# Patient Record
Sex: Male | Born: 1995 | Race: White | Hispanic: No | Marital: Single | Smoking: Former smoker
Health system: Southern US, Community
[De-identification: ages and names within clinical notes are randomized; demographics above are authoritative.]

---
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
## 2014-10-28 ENCOUNTER — Emergency Department (HOSPITAL_BASED_OUTPATIENT_CLINIC_OR_DEPARTMENT_OTHER): Payer: Medicaid Other

## 2014-10-28 ENCOUNTER — Emergency Department (HOSPITAL_BASED_OUTPATIENT_CLINIC_OR_DEPARTMENT_OTHER)
Admission: EM | Admit: 2014-10-28 | Discharge: 2014-10-28 | Disposition: A | Discharge status: Home / Self Care | Payer: Medicaid Other | Attending: Emergency Medicine | Admitting: Emergency Medicine

## 2014-10-28 ENCOUNTER — Encounter (HOSPITAL_BASED_OUTPATIENT_CLINIC_OR_DEPARTMENT_OTHER): Payer: Self-pay

## 2014-10-28 DIAGNOSIS — Z79899 Other long term (current) drug therapy: Secondary | ICD-10-CM | POA: Insufficient documentation

## 2014-10-28 DIAGNOSIS — M25562 Pain in left knee: Secondary | ICD-10-CM | POA: Insufficient documentation

## 2014-10-28 DIAGNOSIS — Z791 Long term (current) use of non-steroidal anti-inflammatories (NSAID): Secondary | ICD-10-CM | POA: Diagnosis not present

## 2014-10-28 NOTE — ED Provider Notes (Signed)
CSN: [id number]     Arrival date & time 10/28/14  1615 History   First MD Initiated Contact with Patient 10/28/14 1616     Chief Complaint  Patient presents with  . Knee Pain     (Consider location/radiation/quality/duration/timing/severity/associated sxs/prior Treatment) Patient is a 19 y.o. male presenting with knee pain.  Knee Pain Location:  Knee Time since incident:  3 months Injury: no   Knee location:  L knee Pain details:    Quality:  Aching   Severity:  Severe   Onset quality:  Gradual   Timing:  Constant   Progression:  Worsening Chronicity:  New Prior injury to area:  No Relieved by:  Nothing Worsened by:  Extension Ineffective treatments:  None tried Associated symptoms: stiffness   Associated symptoms: no back pain, no fever, no itching, no muscle weakness, no neck pain, no numbness and no swelling     History reviewed. No pertinent past medical history. History reviewed. No pertinent past surgical history. No family history on file. History  Substance Use Topics  . Smoking status: Never Smoker   . Smokeless tobacco: Never Used  . Alcohol Use: No    Review of Systems  Constitutional: Negative for fever.  Musculoskeletal: Positive for stiffness. Negative for back pain and neck pain.  Skin: Negative for itching.  All other systems reviewed and are negative.     Allergies  Review of patient's allergies indicates no known allergies.  Home Medications   Prior to Admission medications   Medication Sig Start Date End Date Taking? Authorizing Provider  diclofenac (VOLTAREN) 50 MG EC tablet Take 50 mg by mouth 2 (two) times daily.   Yes Historical Provider, MD  HYDROCODONE BITARTRATE PO Take by mouth.   Yes Historical Provider, MD  pantoprazole (PROTONIX) 20 MG tablet Take 1 tablet (20 mg total) by mouth 2 (two) times daily. 06/02/13   Toy CookeyMegan Docherty, MD   BP 133/81 mmHg  Pulse 114  Temp(Src) 98.6 F (37 C) (Oral)  Resp 18  Ht 5\' 8"  (1.727 m)  Wt  133 lb (60.328 kg)  BMI 20.23 kg/m2  SpO2 100% Physical Exam  Constitutional: He is oriented to person, place, and time. He appears well-developed and well-nourished.  HENT:  Head: Normocephalic and atraumatic.  Eyes: Conjunctivae and EOM are normal.  Neck: Normal range of motion. Neck supple.  Cardiovascular: Normal rate, regular rhythm and normal heart sounds.   Pulmonary/Chest: Effort normal and breath sounds normal. No respiratory distress.  Abdominal: He exhibits no distension. There is no tenderness. There is no rebound and no guarding.  Musculoskeletal: Normal range of motion.       Left knee: He exhibits normal range of motion, no swelling, no erythema, normal alignment, no LCL laxity, normal patellar mobility, normal meniscus and no MCL laxity. Tenderness (diffuse) found.  Neurological: He is alert and oriented to person, place, and time.  Skin: Skin is warm and dry.  Vitals reviewed.   ED Course  Procedures (including critical care time) Labs Review Labs Reviewed - No data to display  Imaging Review Dg Knee Complete 4 Views Left  10/28/2014   CLINICAL DATA:  Lt knee pain at patella has worsened over a 2 month period. No old injury known.  EXAM: LEFT KNEE - COMPLETE 4+ VIEW  COMPARISON:  None.  FINDINGS: There is no evidence of fracture, dislocation, or joint effusion. There is no evidence of arthropathy or other focal bone abnormality. Soft tissues are unremarkable.  IMPRESSION: Negative.  Electronically Signed   By: Amie Portland M.D.   On: 10/28/2014 17:27     EKG Interpretation None      MDM   Final diagnoses:  Left knee pain    19 y.o. male without pertinent PMH presents with atraumatic L knee pain x 2-3 months, worsening over last 2 weeks.  Immediate precipitant for visit was that he states he was told by a pharmacist when asking about a knee brace that he needed to come to the ED for an MRI of his knee.  On arrival vitals and physical exam as above.  No signs  of SA, FROM with some pain.  No ligamentous instability appreciated.  No swelling of calf or leg, pt states it is occasionally tender, but is not on my exam today.  Sensation intact distally.  Pulses 2+.  XR obtained here unremarkable.  DC home to fu with orthopedics.    I have reviewed all laboratory and imaging studies if ordered as above  1. Left knee pain         Mirian Mo, MD 10/28/14 [id number]

## 2014-10-28 NOTE — ED Notes (Signed)
Pt reports left knee pain x 2 weeks, pain in left groin, reports kneecap "always hurting".  Denies injury. States needs mri, has not had xray.

## 2014-10-28 NOTE — Discharge Instructions (Signed)

## 2014-10-29 ENCOUNTER — Encounter (HOSPITAL_BASED_OUTPATIENT_CLINIC_OR_DEPARTMENT_OTHER): Payer: Self-pay | Admitting: Emergency Medicine

## 2014-10-29 ENCOUNTER — Emergency Department (HOSPITAL_BASED_OUTPATIENT_CLINIC_OR_DEPARTMENT_OTHER)
Admission: EM | Admit: 2014-10-29 | Discharge: 2014-10-29 | Disposition: A | Discharge status: Home / Self Care | Payer: Medicaid Other | Attending: Emergency Medicine | Admitting: Emergency Medicine

## 2014-10-29 DIAGNOSIS — M25562 Pain in left knee: Secondary | ICD-10-CM | POA: Diagnosis not present

## 2014-10-29 DIAGNOSIS — Z791 Long term (current) use of non-steroidal anti-inflammatories (NSAID): Secondary | ICD-10-CM | POA: Diagnosis not present

## 2014-10-29 DIAGNOSIS — Z79899 Other long term (current) drug therapy: Secondary | ICD-10-CM | POA: Insufficient documentation

## 2014-10-29 NOTE — Discharge Instructions (Signed)

## 2014-10-29 NOTE — ED Notes (Signed)
Pt recently here yesterday for left knee pain. Knee is wrapped. States last night foot on left side was cold to touch. Was told to return to be checked on.

## 2014-10-29 NOTE — ED Provider Notes (Signed)
TIME SEEN: 11:14 PM  CHIEF COMPLAINT: Left knee pain  HPI:  Reginald [name] is a 19 y.o. male who presents to the Emergency Department complaining of worsening left knee pain onset 2 months. Pt denies an injury to the knee. Pt was seen here yesterday for the same symptoms, pt notes that he was informed to return to be checked after he was called this afternoon. Pt reports that last night his foot on the left side was cold to the touch and that the ace wrap on his knee was not too tight. He is unable to tell me if his right foot was also cold. Denies any discoloration. Has an appointment to see Dr. Pearletha Forge tomorrow. Denies numbness or focal weakness. States his foot is warm today.   Pt has numerous issues with his health and he does not currently have a PCP to visit. States he has had years worth of pain intermittently in his chest, abdomen, back, neck that will shoot throughout his body. He has not had any symptoms currently and has not had any of the symptoms in the past several days.   ROS: See HPI Constitutional: no fever  Eyes: no drainage  ENT: no runny nose   Cardiovascular:  no chest pain  Resp: no SOB  GI: no vomiting GU: no dysuria Integumentary: no rash  Allergy: no hives  Musculoskeletal: no leg swelling  Neurological: no slurred speech ROS otherwise negative  PAST MEDICAL HISTORY/PAST SURGICAL HISTORY:  History reviewed. No pertinent past medical history.  MEDICATIONS:  Prior to Admission medications   Medication Sig Start Date End Date Taking? Authorizing Provider  diclofenac (VOLTAREN) 50 MG EC tablet Take 50 mg by mouth 2 (two) times daily.   Yes Historical Provider, MD  HYDROCODONE BITARTRATE PO Take by mouth.   Yes Historical Provider, MD  pantoprazole (PROTONIX) 20 MG tablet Take 1 tablet (20 mg total) by mouth 2 (two) times daily. 06/02/13   Toy Cookey, MD    ALLERGIES:  No Known Allergies  SOCIAL HISTORY:  History  Substance Use Topics  . Smoking status:  Never Smoker   . Smokeless tobacco: Never Used  . Alcohol Use: No    FAMILY HISTORY: No family history on file.  EXAM: BP 121/79 mmHg  Pulse 100  Temp(Src) 98.3 F (36.8 C) (Oral)  Resp 18  Ht  (1.727 m)  Wt 133 lb (60.328 kg)  BMI 20.23 kg/m2  SpO2 97% CONSTITUTIONAL: Alert and oriented and responds appropriately to questions. Well-appearing; well-nourished, appears very anxious, nontoxic, afebrile HEAD: Normocephalic EYES: Conjunctivae clear, PERRL ENT: normal nose; no rhinorrhea; moist mucous membranes; pharynx without lesions noted NECK: Supple, no meningismus, no LAD  CARD: RRR; S1 and S2 appreciated; no murmurs, no clicks, no rubs, no gallops RESP: Normal chest excursion without splinting or tachypnea; breath sounds clear and equal bilaterally; no wheezes, no rhonchi, no rales, no hypoxia or respiratory distress, speaking full sentences ABD/GI: Normal bowel sounds; non-distended; soft, non-tender, no rebound, no guarding, no peritoneal signs BACK:  The back appears normal and is non-tender to palpation, there is no CVA tenderness EXT:  tender to palpation over the left knee without bony deformity or joint effusion, no erythema or warmth, no ligamentous laxity, no bony deformity, Normal ROM in all joints; otherwise extremities are non-tender to palpation; no edema; normal capillary refill; no cyanosis, no calf tenderness or swelling; both of his lower extremities are warm and well perfused with strong palpable DP pulses bilaterally  SKIN: Normal color for age and race; warm NEURO: Moves all extremities equally, sensation to light touch intact diffusely, cranial nerves II through XII intact PSYCH: Patient appears very anxious. Grooming and personal hygiene are appropriate.  MEDICAL DECISION MAKING: Patient here with an episode of feeling like his left foot was numb last night. Symptoms are now gone. He has strong pulses, no calf tenderness or swelling and has normal  sensation. No bony deformity on exam and has had negative x-rays yesterday. No sign of septic arthritis. This may have been secondary to having his Ace wrapped too tight last night. Have advised him to leave off the knee brace and Ace wrap at this time. I do not feel he needs further emergent workup at this time. Discussed with patient that I do not feel he has any arterial obstruction, DVT or other emergent condition. He has follow-up tomorrow.  Patient also complains of multiple other painful complaints that have been present intermittently for years. None of them have occurred in the last several days or present currently. I do not feel these need emergent workup at this time I have discussed with him the importance of primary care follow-up. I feel that anxiety contributes significantly to patient's multiple symptoms.  I personally performed the services described in this documentation, which was scribed in my presence. The recorded information has been reviewed and is accurate.   Layla MawKristen N Apurva Reily, DO 10/30/14 [phone]

## 2014-10-30 ENCOUNTER — Encounter: Payer: Self-pay | Admitting: Family Medicine

## 2014-10-30 ENCOUNTER — Ambulatory Visit (INDEPENDENT_AMBULATORY_CARE_PROVIDER_SITE_OTHER): Payer: Medicaid Other | Admitting: Family Medicine

## 2014-10-30 ENCOUNTER — Ambulatory Visit: Payer: Medicaid Other | Admitting: Family Medicine

## 2014-10-30 VITALS — BP 134/71 | HR 123 | Ht 69.0 in | Wt 135.0 lb

## 2014-10-30 DIAGNOSIS — M25562 Pain in left knee: Secondary | ICD-10-CM

## 2014-10-30 DIAGNOSIS — M545 Low back pain, unspecified: Secondary | ICD-10-CM

## 2014-10-30 MED ORDER — DICLOFENAC SODIUM 75 MG PO TBEC: 75.0000 mg | DELAYED_RELEASE_TABLET | Freq: Two times a day (BID) | ORAL | Status: DC

## 2014-10-30 NOTE — Patient Instructions (Signed)
Your knee pain may be from a small meniscus tear. Otherwise your exam is normal and reassuring. You would have to do 6 weeks of conservative treatment before an MRI would be approved for this issue. Diclofenac twice a day with food. Knee brace for support. Icing if needed 15 minutes at a time 3-4 times a day. Elevate above your heart as needed for swelling. Follow up with me in 6 weeks for both issues. If not improving MRI would be the next step.  Whether your back pain is from a bulging disc or lumbar strain these are treated similarly initially. Take tylenol for baseline pain relief (1-2 extra strength tabs 3x/day) Diclofenac twice a day with food for pain and inflammation. Consider robaxin as needed for muscle spasms (no driving on this medicine). Physical therapy and home exercises are important for long term pain relief. If not improving, will consider further imaging (MRI).

## 2014-11-04 DIAGNOSIS — M25562 Pain in left knee: Secondary | ICD-10-CM | POA: Insufficient documentation

## 2014-11-04 DIAGNOSIS — M545 Low back pain, unspecified: Secondary | ICD-10-CM | POA: Insufficient documentation

## 2014-11-04 NOTE — Progress Notes (Signed)
PCP: No PCP Per Patient  Subjective:   HPI: Patient is a 19 y.o. male here for left knee pain.  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 Denies known injury or trauma. 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 Hurts to put any pressure on leg. Pain level 8/10. Difficulty straightening the leg. + limping. Radiographs negative. He also mentioned at end of visit about his back pain starting about the same time.  No past medical history on file.  Current Outpatient Prescriptions on File Prior to Visit  Medication Sig Dispense Refill  . pantoprazole (PROTONIX) 20 MG tablet Take 1 tablet (20 mg total) by mouth 2 (two) times daily. 60 tablet 0   No current facility-administered medications on file prior to visit.    No past surgical history on file.  No Known Allergies  History   Social History  . Marital Status: Single    Spouse Name: N/A  . Number of Children: N/A  . Years of Education: N/A   Occupational History  . Not on file.   Social History Main Topics  . Smoking status: Never Smoker   . Smokeless tobacco: Never Used  . Alcohol Use: No  . Drug Use: No  . Sexual Activity: Not on file   Other Topics Concern  . Not on file   Social History Narrative    No family history on file.  BP 134/71 mmHg  Pulse 123  Ht 5\' 9"  (1.753 m)  Wt 135 lb (61.236 kg)  BMI 19.93 kg/m2  Review of Systems: See HPI above.    Objective:  Physical Exam:  Gen: NAD  Left knee: No gross deformity, ecchymoses, effusion.  Guarding. Diffuse TTP. ROM 0 - 100 degrees, guarding. Negative ant/post drawers. Negative valgus/varus testing. Negative lachmanns. Negative mcmurrays, apleys though both with pain.  Negative patellar apprehension. NV intact distally.    Assessment & Plan:  1. Left knee pain - Most testing benign.  No known injury though possible he has a small meniscus  tear in his knee.  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  Start with conservative measures - diclofenac, knee brace, icing, home exercises.  Consider MRI if not improving at f/u in 6 weeks.  2. Low back pain - Did not examine today as brought up at very end of visit - could dedicate future appointment to evaluation.  Given generic instructions, exercises to start.

## 2014-11-04 NOTE — Assessment & Plan Note (Signed)
Did not examine today as brought up at very end of visit - could dedicate future appointment to evaluation.  Given generic instructions, exercises to start.

## 2014-11-04 NOTE — Assessment & Plan Note (Signed)
Most testing benign.  No known injury though possible he has a small meniscus tear in his knee.  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  Start with conservative measures - diclofenac, knee brace, icing, home exercises.  Consider MRI if not improving at f/u in 6 weeks.

## 2014-11-27 ENCOUNTER — Emergency Department (HOSPITAL_BASED_OUTPATIENT_CLINIC_OR_DEPARTMENT_OTHER)
Admission: EM | Admit: 2014-11-27 | Discharge: 2014-11-28 | Disposition: A | Discharge status: Home / Self Care | Payer: Medicaid Other | Attending: Emergency Medicine | Admitting: Emergency Medicine

## 2014-11-27 ENCOUNTER — Encounter (HOSPITAL_BASED_OUTPATIENT_CLINIC_OR_DEPARTMENT_OTHER): Payer: Self-pay | Admitting: *Deleted

## 2014-11-27 DIAGNOSIS — M25512 Pain in left shoulder: Secondary | ICD-10-CM | POA: Insufficient documentation

## 2014-11-27 DIAGNOSIS — M79605 Pain in left leg: Secondary | ICD-10-CM | POA: Insufficient documentation

## 2014-11-27 DIAGNOSIS — R0789 Other chest pain: Secondary | ICD-10-CM | POA: Insufficient documentation

## 2014-11-27 DIAGNOSIS — R079 Chest pain, unspecified: Secondary | ICD-10-CM | POA: Diagnosis present

## 2014-11-27 DIAGNOSIS — R6889 Other general symptoms and signs: Secondary | ICD-10-CM

## 2014-11-27 DIAGNOSIS — M25562 Pain in left knee: Secondary | ICD-10-CM | POA: Diagnosis not present

## 2014-11-27 DIAGNOSIS — M79604 Pain in right leg: Secondary | ICD-10-CM | POA: Insufficient documentation

## 2014-11-27 DIAGNOSIS — F419 Anxiety disorder, unspecified: Secondary | ICD-10-CM | POA: Insufficient documentation

## 2014-11-27 DIAGNOSIS — Z720 Tobacco use: Secondary | ICD-10-CM | POA: Diagnosis not present

## 2014-11-27 DIAGNOSIS — M25511 Pain in right shoulder: Secondary | ICD-10-CM | POA: Diagnosis not present

## 2014-11-27 NOTE — ED Notes (Signed)
Pt reports generalized body aches and pain in left chest that has been bothering him intermittently over the last year to year and a half. Currently denies SOB skin is P/W/D and chest pain that occurs with deep breathing. Mr Reginald [name] also report increased SOB when changing position from lying to standing. Pt also c/o intermittent loss of memory and mild confusion while at work.       Mr Reginald [name] has also requested an MRI of his body to find out why he is in pain and having cognitive difficulty. He is very insistent that an MRI will help found out what is wrong with him.

## 2014-11-27 NOTE — ED Notes (Signed)
Pt has generalized body aches, memory loss, dizziness.  Multiple complaints.  No acute distress noted, pt ambulatory, A/O x 4.

## 2014-11-27 NOTE — ED Notes (Signed)
Pt asked for STD test Reported to RN

## 2014-11-27 NOTE — ED Provider Notes (Addendum)
TIME SEEN:  This chart was scribed for Layla Maw Ward, DO by Octavia Heir, ED Scribe. This patient was seen in room MH11/MH11 and the patient's care was started at 11:54 PM.   CHIEF COMPLAINT: multiple complaints  HPI:  HPI Comments: Reginald [name] is a 19 y.o. male who presents to the Emergency Department complaining of multiple complaints. Pt reports having intermittent left sided chest pain with increase pain upon movement onset one year ago. Pt also reports having bilateral leg and bilateral shoulder pain that is worse with movement and palpation without history of injury. 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 Describes this as episodes were he can't remember what he is doing. Denies numbness, tingling or focal weakness. Also complains of lightheadedness with standing. No fever, cough, vomiting or diarrhea. No shortness of breath. States he also had pain with an erection recently. No dysuria or hematuria. He is requesting that we do STD screening today. No penile discharge, testicular pain or swelling. Patient is requesting that we do a "full body MRI" to figure out what is wrong with him. States he is followed up with a primary care physician and told that nothing was wrong.  ROS: See HPI Constitutional: no fever  Eyes: no drainage  ENT: no runny nose   Cardiovascular:   chest pain  Resp: no SOB  GI: no vomiting GU: no dysuria Integumentary: no rash  Allergy: no hives  Musculoskeletal: no leg swelling  Neurological: no slurred speech ROS otherwise negative  PAST MEDICAL HISTORY/PAST SURGICAL HISTORY:  History reviewed. No pertinent past medical history.  MEDICATIONS:  Prior to Admission medications   Not on File    ALLERGIES:  No Known Allergies  SOCIAL HISTORY:  History  Substance Use Topics  . Smoking status: Current Every Day Smoker -- 0.50 packs/day    Types: Cigarettes  . Smokeless tobacco: Never Used  . Alcohol Use: No    FAMILY  HISTORY: History reviewed. No pertinent family history.  EXAM: Triage vitals: BP 117/76 mmHg  Pulse 100  Temp(Src) 98.5 F (36.9 C) (Oral)  Resp 18  Ht  (1.727 m)  Wt 140 lb (63.504 kg)  BMI 21.29 kg/m2  SpO2 95% CONSTITUTIONAL: Alert and oriented 3 and responds appropriately to questions. Well-appearing; well-nourished, laughing, in no distress, nontoxic, afebrile HEAD: Normocephalic EYES: Conjunctivae clear, PERRL, extraocular movements intact, no scleral icterus ENT: normal nose; no rhinorrhea; moist mucous membranes; pharynx without lesions noted, no tonsillar hypertrophy or exudate, no uvular deviation, normal phonation, no sign of dental abscess, no Ludwig angina NECK: Supple, no meningismus, no LAD; no midline spinal tenderness or step-off or deformity CARD: RRR; S1 and S2 appreciated; no murmurs, no clicks, no rubs, no gallops CHEST:  Tender to palpation over the left chest wall without crepitus or ecchymosis or deformity RESP: Normal chest excursion without splinting or tachypnea; breath sounds clear and equal bilaterally; no wheezes, no rhonchi, no rales, no hypoxia or respiratory distress, speaking full sentences ABD/GI: Normal bowel sounds; non-distended; soft, non-tender, no rebound, no guarding, no peritoneal signs BACK:  The back appears normal, there is no CVA tenderness, tender to palpation over the bilateral trapezius and rhomboid muscles without associated lesions, no midline spinal tenderness or step-off or deformity EXT: Patient's left knee is in a brace and he complains of pain in this area but has had negative x-rays on a recent visit July 7 with no new injury, no joint effusion, compartments are soft, Normal  ROM in all joints; otherwise extremities are non-tender to palpation; no edema; normal capillary refill; no cyanosis, no calf tenderness or swelling    SKIN: Normal color for age and race; warm, no rash NEURO: Moves all extremities equally, sensation to  light touch intact diffusely, cranial nerves II through XII intact, no dysmetria to finger to nose testing, normal gait PSYCH: Patient appears anxious, will laugh inappropriately. No SI or HI.   MEDICAL DECISION MAKING: Patient with multiple vague complaints that he reports he has had intermittently for the past year. He is requesting that we do a "full body MRI" and states that he was upset that his primary care physician told him that nothing was wrong with him. His exam today is completely normal and his vital signs are also normal. He is not orthostatic. EKG shows no ischemic changes, arrhythmia or interval changes. Screening labs, urine today are also completely unremarkable. STD screening is pending. Discussed with patient if any of this was positive he would be contacted. I have discussed with patient at length that I do not feel there is an emergent life-threatening illness present and I recommend follow-up with primary care physician. I suspect that many of his symptoms are due to anxiety which I have discussed with him on this visit as well as a previous visit when I saw this patient in July.  Discussed return precautions. He verbalized understanding.    Layla Maw Ward, DO 11/28/14 0122   EKG Interpretation  Date/Time:  Thursday November 27 2014 23:43:12 EDT Ventricular Rate:  92 PR Interval:  174 QRS Duration: 84 QT Interval:  338 QTC Calculation: 417 R Axis:   78 Text Interpretation:  Normal sinus rhythm Normal ECG Confirmed by WARD,  DO, KRISTEN ([id number]) on 11/27/2014 11:51:03 PM        Layla Maw Ward, DO 11/28/14 0122

## 2014-11-28 ENCOUNTER — Telehealth: Payer: Self-pay | Admitting: Family Medicine

## 2014-11-28 LAB — CBC WITH DIFFERENTIAL/PLATELET
BASOS ABS: 0.1 10*3/uL (ref 0.0–0.1)
BASOS PCT: 1 % (ref 0–1)
EOS ABS: 0.3 10*3/uL (ref 0.0–0.7)
Eosinophils Relative: 5 % (ref 0–5)
HCT: 44.5 % (ref 39.0–52.0)
Hemoglobin: 14.9 g/dL (ref 13.0–17.0)
LYMPHS ABS: 1.7 10*3/uL (ref 0.7–4.0)
Lymphocytes Relative: 28 % (ref 12–46)
MCH: 28.9 pg (ref 26.0–34.0)
MCHC: 33.5 g/dL (ref 30.0–36.0)
MCV: 86.2 fL (ref 78.0–100.0)
MONO ABS: 0.5 10*3/uL (ref 0.1–1.0)
MONOS PCT: 9 % (ref 3–12)
NEUTROS ABS: 3.6 10*3/uL (ref 1.7–7.7)
Neutrophils Relative %: 57 % (ref 43–77)
PLATELETS: 191 10*3/uL (ref 150–400)
RBC: 5.16 MIL/uL (ref 4.22–5.81)
RDW: 15.3 % (ref 11.5–15.5)
WBC: 6.2 10*3/uL (ref 4.0–10.5)

## 2014-11-28 LAB — URINALYSIS, ROUTINE W REFLEX MICROSCOPIC
Bilirubin Urine: NEGATIVE
Glucose, UA: NEGATIVE mg/dL
Hgb urine dipstick: NEGATIVE
Ketones, ur: NEGATIVE mg/dL
LEUKOCYTES UA: NEGATIVE
Nitrite: NEGATIVE
PROTEIN: NEGATIVE mg/dL
Specific Gravity, Urine: 1.025 (ref 1.005–1.030)
Urobilinogen, UA: 0.2 mg/dL (ref 0.0–1.0)
pH: 6 (ref 5.0–8.0)

## 2014-11-28 LAB — COMPREHENSIVE METABOLIC PANEL
ALT: 15 U/L — ABNORMAL LOW (ref 17–63)
ANION GAP: 9 (ref 5–15)
AST: 24 U/L (ref 15–41)
Albumin: 4.1 g/dL (ref 3.5–5.0)
Alkaline Phosphatase: 49 U/L (ref 38–126)
BUN: 13 mg/dL (ref 6–20)
CO2: 24 mmol/L (ref 22–32)
Calcium: 9.2 mg/dL (ref 8.9–10.3)
Chloride: 107 mmol/L (ref 101–111)
Creatinine, Ser: 0.96 mg/dL (ref 0.61–1.24)
GFR calc Af Amer: >60 mL/min (ref >60)
GFR calc non Af Amer: >60 mL/min (ref >60)
GLUCOSE: 89 mg/dL (ref 65–99)
Potassium: 3.6 mmol/L (ref 3.5–5.1)
SODIUM: 140 mmol/L (ref 135–145)
Total Bilirubin: 0.5 mg/dL (ref 0.3–1.2)
Total Protein: 6.7 g/dL (ref 6.5–8.1)

## 2014-11-28 NOTE — Telephone Encounter (Signed)
Spoke to patient and told him that he needed to make a follow up appointment and we could discuss options about his left knee. Appointment made.

## 2014-11-28 NOTE — Discharge Instructions (Signed)
You were seen in the emergency department for multiple different complaints. Your blood work today has been normal as well as your urine. Your exam was also normal as well as your vital signs. I do not feel there is an emergent condition present and I recommend follow-up with your primary care physician for continued evaluation for your complaints.  We have also tested you for sexually transmitted diseases. These tests will take several days to come back. If they're positive, you will be contacted.

## 2014-11-29 LAB — RPR: RPR Ser Ql: NONREACTIVE

## 2014-11-29 LAB — HIV ANTIBODY (ROUTINE TESTING W REFLEX): HIV Screen 4th Generation wRfx: NONREACTIVE

## 2014-12-11 ENCOUNTER — Ambulatory Visit: Payer: Medicaid Other | Admitting: Family Medicine

## 2014-12-16 ENCOUNTER — Encounter (INDEPENDENT_AMBULATORY_CARE_PROVIDER_SITE_OTHER): Payer: Self-pay

## 2014-12-16 ENCOUNTER — Encounter: Payer: Self-pay | Admitting: Family Medicine

## 2014-12-16 ENCOUNTER — Ambulatory Visit (INDEPENDENT_AMBULATORY_CARE_PROVIDER_SITE_OTHER): Payer: Medicaid Other | Admitting: Family Medicine

## 2014-12-16 VITALS — BP 113/68 | HR 84 | Ht 68.0 in | Wt 140.0 lb

## 2014-12-16 DIAGNOSIS — M25562 Pain in left knee: Secondary | ICD-10-CM

## 2014-12-20 ENCOUNTER — Ambulatory Visit (HOSPITAL_BASED_OUTPATIENT_CLINIC_OR_DEPARTMENT_OTHER)
Admission: RE | Admit: 2014-12-20 | Discharge: 2014-12-20 | Disposition: A | Discharge status: Home / Self Care | Payer: Medicaid Other | Source: Ambulatory Visit | Attending: Family Medicine | Admitting: Family Medicine

## 2014-12-20 DIAGNOSIS — M25562 Pain in left knee: Secondary | ICD-10-CM | POA: Diagnosis present

## 2014-12-23 NOTE — Progress Notes (Signed)
PCP: No PCP Per Patient  Subjective:   HPI: Patient is a 19 y.o. male here for left knee pain.  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 Hurts to put any pressure on leg. Pain level 8/10. Difficulty straightening the leg. + limping. Radiographs negative. He also mentioned at end of visit about his back pain starting about the same time.  8/23: Patient reports his pain level is 6/10 still. Is limping though having mild improvement since last visit only. Doing home exercises - had not heard from PT. Using knee brace, heat, elevating as well. He had missed 7/19-21 at work due to knee issues. Has crutches he used during this time but not needing at this point. No catching, locking.  No past medical history on file.  No current outpatient prescriptions on file prior to visit.   No current facility-administered medications on file prior to visit.    No past surgical history on file.  No Known Allergies  Social History   Social History  . Marital Status: Single    Spouse Name: N/A  . Number of Children: N/A  . Years of Education: N/A   Occupational History  . Not on file.   Social History Main Topics  . Smoking status: Current Every Day Smoker -- 0.50 packs/day    Types: Cigarettes  . Smokeless tobacco: Never Used  . Alcohol Use: No  . Drug Use: No  . Sexual Activity: Not on file   Other Topics Concern  . Not on file   Social History Narrative    No family history on file.  BP 113/68 mmHg  Pulse 84  Ht  (1.727 m)  Wt 140 lb (63.504 kg)  BMI 21.29 kg/m2  Review of Systems: See HPI above.    Objective:  Physical Exam:  Gen: NAD  Left knee: No gross deformity, ecchymoses, effusion. Diffuse TTP including joint lines, post patellar facets. FROM. Negative ant/post  drawers. Negative valgus/varus testing. Negative lachmanns. Painful mcmurrays, apleys.  Painful patellar apprehension. NV intact distally.    Assessment & Plan:  1. Left knee pain - Most testing benign aside from diffuse pain, has pain with meniscal testing.  Not improving as expected over 6 weeks with HEP, bracing.  Will go ahead with MRI to further assess for meniscus tear.  Addendum:  MRI reviewed and discussed with patient.  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  Reassured patient.  Will start physical therapy, plan to follow-up in 6 weeks.

## 2014-12-23 NOTE — Assessment & Plan Note (Signed)
Most testing benign aside from diffuse pain, has pain with meniscal testing.  Not improving as expected over 6 weeks with HEP, bracing.  Will go ahead with MRI to further assess for meniscus tear.

## 2016-02-15 ENCOUNTER — Encounter (HOSPITAL_BASED_OUTPATIENT_CLINIC_OR_DEPARTMENT_OTHER): Payer: Self-pay | Admitting: *Deleted

## 2016-02-15 ENCOUNTER — Emergency Department (HOSPITAL_BASED_OUTPATIENT_CLINIC_OR_DEPARTMENT_OTHER)
Admission: EM | Admit: 2016-02-15 | Discharge: 2016-02-15 | Disposition: A | Discharge status: Home / Self Care | Payer: Medicaid Other | Attending: Emergency Medicine | Admitting: Emergency Medicine

## 2016-02-15 DIAGNOSIS — H9313 Tinnitus, bilateral: Secondary | ICD-10-CM

## 2016-02-15 DIAGNOSIS — T39011A Poisoning by aspirin, accidental (unintentional), initial encounter: Secondary | ICD-10-CM

## 2016-02-15 DIAGNOSIS — F17210 Nicotine dependence, cigarettes, uncomplicated: Secondary | ICD-10-CM | POA: Diagnosis not present

## 2016-02-15 LAB — BASIC METABOLIC PANEL
Anion gap: 5 (ref 5–15)
BUN: 14 mg/dL (ref 6–20)
CO2: 21 mmol/L — ABNORMAL LOW (ref 22–32)
Calcium: 8.5 mg/dL — ABNORMAL LOW (ref 8.9–10.3)
Chloride: 111 mmol/L (ref 101–111)
Creatinine, Ser: 0.93 mg/dL (ref 0.61–1.24)
GFR calc Af Amer: >60 mL/min (ref >60)
GLUCOSE: 129 mg/dL — AB (ref 65–99)
Potassium: 3.8 mmol/L (ref 3.5–5.1)
Sodium: 137 mmol/L (ref 135–145)

## 2016-02-15 LAB — RAPID URINE DRUG SCREEN, HOSP PERFORMED
Amphetamines: NOT DETECTED
BARBITURATES: NOT DETECTED
Benzodiazepines: NOT DETECTED
COCAINE: NOT DETECTED
Opiates: NOT DETECTED
TETRAHYDROCANNABINOL: NOT DETECTED

## 2016-02-15 LAB — ACETAMINOPHEN LEVEL: Acetaminophen (Tylenol), Serum: <10 ug/mL — ABNORMAL LOW (ref 10–30)

## 2016-02-15 LAB — SALICYLATE LEVEL
Salicylate Lvl: 21.1 mg/dL (ref 2.8–30.0)
Salicylate Lvl: 16 mg/dL (ref 2.8–30.0)

## 2016-02-15 MED ORDER — SODIUM CHLORIDE 0.9 % IV BOLUS (SEPSIS)
1000.0000 mL | Freq: Once | INTRAVENOUS | Status: AC
  Administered 2016-02-15: 1000 mL via INTRAVENOUS

## 2016-02-15 NOTE — ED Notes (Signed)
Advised of delay. 

## 2016-02-15 NOTE — ED Notes (Signed)
Pt continues to laugh and does not seem to understand the seriousness of the amount of aspirin he took.  Attempted to once again explain to patient the damage that too much medication at one time can do to your body.  Pt states, "next time I should just not get a headache that doesn't go away."  Pt was advised that next time he shouldn't take so much aspirin.

## 2016-02-15 NOTE — ED Notes (Signed)
Alona BeneJoyce, RN at poison control verified that pt is ok to be discharged since ASA level is decreasing and pt's electrolytes are WNL.

## 2016-02-15 NOTE — ED Notes (Signed)
Spoke with Rosiland Ozoshonna, RN from poison control who recommends an EKG and IV rehydration with normal saline.  She recommends a recheck of the aspirin level in three hours as well as electrolytes and a tylenol level.  If the aspirin level is decreasing, pt can continue to hydrate, if it is increasing, pt may need urine alkalinization.

## 2016-02-15 NOTE — ED Notes (Signed)
Pt states he may have been taking more Aspirin than he should have. This has happened before.

## 2016-02-15 NOTE — Discharge Instructions (Signed)
Take over-the-counter medications such as aspirin or ibuprofen as prescribed on the packaging. Do not exceed the recommended amount. Follow-up with the [facility] in 2-3 days for follow-up and to establish primary care. Return to the emergency department if you do take more than the recommended amount or you experience vomiting, fatigue, fever, chest pain, shortness of breath or any other concerning symptoms.

## 2016-02-15 NOTE — ED Provider Notes (Signed)
[facility] Provider Note   CSN: [id number] Arrival date & time: 02/15/16  1625  By signing my name below, I, Doreatha Martin, attest that this documentation has been prepared under the direction and in the presence of  Mattie Marlin, PA-C. Electronically Signed: Doreatha Martin, ED Scribe. 02/15/16. 7:51 PM.    History   Chief Complaint Chief Complaint  Patient presents with  . Tinnitus    HPI Chanler Mendonca is a 20 y.o. male who presents to the Emergency Department complaining of moderate, constant, bilateral tinnitus onset this morning with associated nausea. Pt states he woke up this morning with his symptoms and describes it as a "high pitched" noise. Pt states he had a HA last night and took "11 or 12" ASA (unknown mg). Per pt, he normally takes either ibuprofen or drinks ETOH to alleviate his HAs; however, he ran out of ibuprofen.  He reports one prior similar episode of tinnitus that lasted 1-2 hours. He denies congestion, ear pain, ear discharge, sore throat, rhinorrhea, current HA, visual changes, vomiting, SI. He is a current daily smoker.    The history is provided by the patient. No language interpreter was used.    History reviewed. No pertinent past medical history.  Patient Active Problem List   Diagnosis Date Noted  . Left knee pain 11/04/2014  . Low back pain 11/04/2014    History reviewed. No pertinent surgical history.     Home Medications    Prior to Admission medications   Not on File    Family History No family history on file.  Social History Social History  Substance Use Topics  . Smoking status: Current Every Day Smoker    Packs/day: 0.50    Types: Cigarettes  . Smokeless tobacco: Never Used  . Alcohol use No     Allergies   Review of patient's allergies indicates no known allergies.   Review of Systems Review of Systems  HENT: Positive for tinnitus. Negative for congestion, ear discharge, ear pain, rhinorrhea and sore  throat.   Eyes: Negative for visual disturbance.  Gastrointestinal: Positive for nausea. Negative for abdominal pain and vomiting.  Neurological: Negative for headaches.  Psychiatric/Behavioral: Negative for suicidal ideas.  All other systems reviewed and are negative.    Physical Exam Updated Vital Signs BP 113/72   Pulse 84   Temp 98.2 F (36.8 C) (Oral)   Resp 16   Ht 5\' 8"  (1.727 m)   Wt 140 lb (63.5 kg)   SpO2 97%   BMI 21.29 kg/m   Physical Exam  Constitutional: He appears well-developed and well-nourished. No distress.  HENT:  Head: Normocephalic and atraumatic.  Right Ear: Tympanic membrane is retracted. Tympanic membrane is not erythematous. A middle ear effusion is present.  Left Ear: Tympanic membrane normal.  Mouth/Throat: Uvula is midline, oropharynx is clear and moist and mucous membranes are normal. No oropharyngeal exudate.  Right TM with mild effusion with some retraction. No erythema or drainage. Left TM normal.   Eyes: Conjunctivae are normal. Pupils are equal, round, and reactive to light.  Neck: Normal range of motion. Neck supple.  Pulmonary/Chest: Effort normal. No respiratory distress.  Musculoskeletal: Normal range of motion.  Neurological: He is alert. Coordination normal.  Skin: Skin is warm and dry. He is not diaphoretic.  Psychiatric: He has a normal mood and affect. His behavior is normal.  Nursing note and vitals reviewed.    ED Treatments / Results   DIAGNOSTIC STUDIES: Oxygen Saturation is  98% on RA, normal by my interpretation.    COORDINATION OF CARE: 7:47 PM Discussed treatment plan with pt at bedside which includes serum salicylate level and pt agreed to plan.  8:00 PM Consulted with poison control, who recommend EKG, hydration via IVF and repeat salicylate level in 3hrs along with BMP, acetaminophen level.   11:40 PM Poison control contacted after labs resulted and consider pt safe for d/c.     Labs (all labs ordered are  listed, but only abnormal results are displayed) Labs Reviewed  ACETAMINOPHEN LEVEL - Abnormal; Notable for the following:       Result Value   Acetaminophen (Tylenol), Serum <10 (*)    All other components within normal limits  BASIC METABOLIC PANEL - Abnormal; Notable for the following:    CO2 21 (*)    Glucose, Bld 129 (*)    Calcium 8.5 (*)    All other components within normal limits  RAPID URINE DRUG SCREEN, HOSP PERFORMED  SALICYLATE LEVEL  SALICYLATE LEVEL    EKG  EKG Interpretation  Date/Time:  Monday February 15 2016 21:08:30 EDT Ventricular Rate:  67 PR Interval:    QRS Duration: 90 QT Interval:  363 QTC Calculation: 384 R Axis:   65 Text Interpretation:  Sinus rhythm RSR' in V1 or V2, probably normal variant No significant change since last tracing Confirmed by Ethelda Chick  MD, SAM [phone]) on 02/15/2016 9:33:15 PM       Radiology No results found.  Procedures Procedures (including critical care time)  Medications Ordered in ED Medications  sodium chloride 0.9 % bolus 1,000 mL (0 mLs Intravenous Stopped 02/15/16 2205)     Initial Impression / Assessment and Plan / ED Course  I have reviewed the triage vital signs and the nursing notes.  Pertinent labs & imaging results that were available during my care of the patient were reviewed by me and considered in my medical decision making (see chart for details).  Clinical Course     Final Clinical Impressions(s) / ED Diagnoses   Final diagnoses:  Tinnitus of both ears  Aspirin overdose, accidental or unintentional, initial encounter    Gale Hulse presents to the ED for evaluation of tinnitus. After Further discussion with the patient and he took 11-12 325 mg aspirin last night for headache. Pt denies that he was trying to harm himself with the pills. He states 5 didn't relieve his headache so after roughly 1 hour the pt took 5-7 more and his headache was relieved within 15-20 minutes. Patient tinnitus  likely 2/2 aspirin ingestion. Poison control consulted for further management of pts care.   Per nursing note: Spoke with Rosiland Oz, RN from poison control who recommends an EKG and IV rehydration with normal saline.  She recommends a recheck of the aspirin level in three hours as well as electrolytes and a tylenol level.  If the aspirin level is decreasing, pt can continue to hydrate, if it is increasing, pt may need urine alkalinization  Pt received fluids, labs unremarkable, EKG without acute changes. Three hour salicylate level decreased to 16.   Per nursing note: Alona Bene, RN at poison control verified that pt is ok to be discharged since ASA level is decreasing and pt's electrolytes are WNL.  Discussed with pt the risk of organ damage with taking NSAIDs and antiinflammatories in excess. Patient advised to follow up with PCP in 2-3 days for reevaluation and recheck of labs. Patient appears stable for discharge at this time. Return precautions  discussed and outlined in discharge paperwork. Patient is agreeable to plan.   Pt case discussed with Dr. Caprice KluverJacubowtiz who agrees with the above plan.   New Prescriptions New Prescriptions   No medications on file   I personally performed the services described in this documentation, which was scribed in my presence. The recorded information has been reviewed and is accurate.      Jerre SimonJessica L Hildagard Sobecki, PA 02/17/16 1741    Doug SouSam Jacubowitz, MD 02/18/16 2107

## 2016-02-15 NOTE — ED Notes (Signed)
ED provider at bedside.

## 2016-02-15 NOTE — ED Notes (Signed)
Pt spoke to roommate and found that the aspirin that he took was 325mg .  Pt ingested 13-15 tablets of 325mg  aspirin yesterday for a headache.  This is not the first time he has done this.

## 2016-02-15 NOTE — ED Notes (Signed)
EMT Lequita HaltMorgan performed EKG.

## 2016-02-15 NOTE — ED Notes (Signed)
Pt refusing EKG, wants to leave.

## 2016-02-15 NOTE — ED Notes (Signed)
Provided pt with microwave meal

## 2016-02-15 NOTE — ED Notes (Signed)
After it was explained to him again how serious the amount of aspirin he took was and how harmful it is to his body, pt agreed to stay.

## 2016-02-15 NOTE — ED Triage Notes (Addendum)
Ringing in his ears since this am. States he has been taking a hand full of ASA every couple of weeks when he gets a headache.

## 2016-02-15 NOTE — ED Notes (Signed)
Patient chose to take the dressing off of his IV site on his own. Patient cautioned that he should keep it on for about 15 - 20 minutes and he refused.

## 2019-04-22 ENCOUNTER — Emergency Department (HOSPITAL_BASED_OUTPATIENT_CLINIC_OR_DEPARTMENT_OTHER)
Admission: EM | Admit: 2019-04-22 | Discharge: 2019-04-22 | Disposition: A | Discharge status: Home / Self Care | Payer: Medicaid Other | Attending: Emergency Medicine | Admitting: Emergency Medicine

## 2019-04-22 ENCOUNTER — Other Ambulatory Visit: Payer: Self-pay

## 2019-04-22 ENCOUNTER — Encounter (HOSPITAL_BASED_OUTPATIENT_CLINIC_OR_DEPARTMENT_OTHER): Payer: Self-pay | Admitting: *Deleted

## 2019-04-22 DIAGNOSIS — G8929 Other chronic pain: Secondary | ICD-10-CM

## 2019-04-22 DIAGNOSIS — M545 Low back pain, unspecified: Secondary | ICD-10-CM

## 2019-04-22 DIAGNOSIS — F17210 Nicotine dependence, cigarettes, uncomplicated: Secondary | ICD-10-CM | POA: Diagnosis not present

## 2019-04-22 DIAGNOSIS — M542 Cervicalgia: Secondary | ICD-10-CM | POA: Insufficient documentation

## 2019-04-22 MED ORDER — MELOXICAM 15 MG PO TABS: 15.0000 mg | ORAL_TABLET | Freq: Every day | ORAL | 0 refills | Status: DC

## 2019-04-22 MED ORDER — CYCLOBENZAPRINE HCL 10 MG PO TABS: 10.0000 mg | ORAL_TABLET | Freq: Every evening | ORAL | 0 refills | Status: DC | PRN

## 2019-04-22 MED ORDER — METHOCARBAMOL 500 MG PO TABS: 500.0000 mg | ORAL_TABLET | Freq: Every day | ORAL | 0 refills | Status: DC

## 2019-04-22 NOTE — ED Provider Notes (Signed)
[facility]PART[facility] Provider Note   CSN: [id number] Arrival date & time: 04/22/19  1201     History Chief Complaint  Patient presents with  . Torticollis    Reginald [name] is a 23 y.o. male with history of chronic back pain presents with acute neck pain.  He states that he has chronic back pain which is been worsening over the past several weeks.  He states he works at [employer].  The pain is severe every day. It shoots up and down his spine.  Yesterday when he was showering he turned his head and started to feel acute neck stiffness and tightness.  It hurts to turn his neck or to move his neck up and down.  He went to an urgent care and got a steroid shot and was given baclofen he states that this has not helped him at all.  He is requesting a different muscle relaxer.  He feels that Flexeril has helped him in the past.  He is also requesting some kind of advanced imaging today since his doctor told him that he may not think he needs it but he wants answers as to why he is having back pain for so long.  He previously followed with a chiropractor but states that last time he saw on they made his pain worse.  He is not have any radiation of pain to the legs and he is ambulatory.  He does not have any radiation of pain into the arms.  He is not a fever.  HPI     History reviewed. No pertinent past medical history.  Patient Active Problem List   Diagnosis Date Noted  . Left knee pain 11/04/2014  . Low back pain 11/04/2014    History reviewed. No pertinent surgical history.     History reviewed. No pertinent family history.  Social History   Tobacco Use  . Smoking status: Current Every Day Smoker    Packs/day: 0.50    Types: Cigarettes  . Smokeless tobacco: Never Used  Substance Use Topics  . Alcohol use: No    Alcohol/week: 0.0 standard drinks  . Drug use: No    Home Medications Prior to Admission medications   Not on File     Allergies    Patient has no known allergies.  Review of Systems   Review of Systems  Constitutional: Negative for fever.  Musculoskeletal: Positive for back pain, myalgias, neck pain and neck stiffness.  Neurological: Negative for weakness and numbness.    Physical Exam Updated Vital Signs BP (!) 153/83 (BP Location: Left Arm)   Pulse 92   Temp 98.2 F (36.8 C)   Resp 18   Ht 5\' 8"  (1.727 m)   Wt 62.5 kg   SpO2 98%   BMI 20.94 kg/m   Physical Exam Vitals and nursing note reviewed.  Constitutional:      General: He is not in acute distress.    Appearance: Normal appearance. He is well-developed. He is not ill-appearing.     Comments: Alert, well appearing. Talkative  HENT:     Head: Normocephalic and atraumatic.  Eyes:     General: No scleral icterus.       Right eye: No discharge.        Left eye: No discharge.     Conjunctiva/sclera: Conjunctivae normal.     Pupils: Pupils are equal, round, and reactive to light.  Neck:     Comments: Pain elicited  with ROM of neck. There is decreased rom. 5/5 upper extremity strength Cardiovascular:     Rate and Rhythm: Normal rate.  Pulmonary:     Effort: Pulmonary effort is normal. No respiratory distress.  Abdominal:     General: There is no distension.  Musculoskeletal:     Cervical back: Normal range of motion. No tenderness.  Skin:    General: Skin is warm and dry.  Neurological:     Mental Status: He is alert and oriented to person, place, and time.  Psychiatric:        Behavior: Behavior normal.     ED Results / Procedures / Treatments   Labs (all labs ordered are listed, but only abnormal results are displayed) Labs Reviewed - No data to display  EKG None  Radiology No results found.  Procedures Procedures (including critical care time)  Medications Ordered in ED Medications - No data to display  ED Course  I have reviewed the triage vital signs and the nursing notes.  Pertinent labs &  imaging results that were available during my care of the patient were reviewed by me and considered in my medical decision making (see chart for details).  23 year old male presents with acute neck pain. When I walk in to the room he is talking on the phone and has a vape device which smells like marijuana and he asks me to come back so he can finish his conversation. He does have some mild stiffness of the neck on exam but there is no tenderness to palpation. Likely acute spasm vs strain. Very low suspicion for serious pathology such as meningitis as he is well appearing and afebrile. He is frustrated about his symptoms that they are chronic and he is not able to get adequate treatment due to financial difficulties. He is also upset that his primary care providers won't order an MRI for him. He is asking for this or a CT scan today. I don't see any emergent reason to get this. He was offered rx for flexeril which helps but states he can't take it during the day due to drowsiness. Will try Robaxin for the day. Will also try NSAID such as mobic. He was encouraged to f/u with his PCP or try alternative therapies such as massage.   MDM Rules/Calculators/A&P                       Final Clinical Impression(s) / ED Diagnoses Final diagnoses:  Acute neck pain  Chronic bilateral low back pain without sciatica    Rx / DC Orders ED Discharge Orders    None       Bethel Born, PA-C 04/22/19 1445    Arby Barrette, MD 04/27/19 1444

## 2019-04-22 NOTE — ED Triage Notes (Addendum)
report neck pain that started while drying his hair yesterday. Pain with turning head-especially to left side.   Pt went to doctor yesterday, received a steroid shot and baclofen. Pt requesting an MRI and something stronger than baclofen.

## 2019-04-22 NOTE — Discharge Instructions (Addendum)
Take mobic once daily for pain Take flexeril at bedtime for sleep Take robaxin during the day to help with muscle pain or spasms. Don't take with the flexeril Please follow up with your PCP and consider seeing another chiropractor or massage therapy

## 2019-04-22 NOTE — ED Notes (Signed)
Pt on phone during d/c.  No acute distress noted. verbalizes understanding of d/c instructions and medication usage.

## 2019-04-24 ENCOUNTER — Other Ambulatory Visit: Payer: Self-pay | Admitting: Nurse Practitioner

## 2019-04-24 ENCOUNTER — Ambulatory Visit
Admission: RE | Admit: 2019-04-24 | Discharge: 2019-04-24 | Disposition: A | Discharge status: Home / Self Care | Payer: Medicaid Other | Source: Ambulatory Visit | Attending: Nurse Practitioner | Admitting: Nurse Practitioner

## 2019-04-24 DIAGNOSIS — M545 Low back pain, unspecified: Secondary | ICD-10-CM

## 2019-04-24 DIAGNOSIS — M542 Cervicalgia: Secondary | ICD-10-CM

## 2019-07-09 ENCOUNTER — Ambulatory Visit (INDEPENDENT_AMBULATORY_CARE_PROVIDER_SITE_OTHER): Payer: Medicaid Other | Admitting: Orthopaedic Surgery

## 2019-07-09 ENCOUNTER — Encounter: Payer: Self-pay | Admitting: Orthopaedic Surgery

## 2019-07-09 ENCOUNTER — Other Ambulatory Visit: Payer: Self-pay

## 2019-07-09 VITALS — BP 112/72 | HR 85 | Ht 69.75 in | Wt 137.0 lb

## 2019-07-09 DIAGNOSIS — G8929 Other chronic pain: Secondary | ICD-10-CM | POA: Diagnosis not present

## 2019-07-09 DIAGNOSIS — M545 Low back pain: Secondary | ICD-10-CM

## 2019-07-09 NOTE — Addendum Note (Signed)
Addended by: Penne Lash, Neysa Bonito N on: 07/09/2019 10:02 AM   Modules accepted: Orders

## 2019-07-09 NOTE — Progress Notes (Signed)
Office Visit Note   Patient: Reginald [name]           Date of Birth: 1996-04-10           MRN: [id number] Visit Date: 07/09/2019              Requested by: No referring provider defined for this encounter. PCP: Patient, No Pcp Per   Assessment & Plan: Visit Diagnoses:  1. Chronic bilateral low back pain without sciatica     Plan: Patient has slight wedging of L2 anterior superior of unknown significance.  No history of injury to suggest compression fracture.  I discussed in that he has trace scoliosis but this is not his pain generator.  We will set him up for some physical therapy check him back again in 4 to 6weeks.  Follow-Up Instructions: Return in about 6 weeks (around 08/20/2019).   Orders:  No orders of the defined types were placed in this encounter.  No orders of the defined types were placed in this encounter.     Procedures: No procedures performed   Clinical Data: No additional findings.   Subjective: Chief Complaint  Patient presents with  . Lower Back - Pain    HPI 24 year old male states he has had chronic constant back pain 100% of the time for many years.  He had x-rays done by his PCP that showed slight scoliosis done on 2/30/20 and states now that he knows why he has back pain and why did not anyone tell him he had scoliosis previously.  He denies associated bowel bladder symptoms.  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  He states the pain is worse on the right than the left radiates in his buttocks is associated with bending turning or twisting.  Previous plain radiographs done December 2020 showed trace scoliosis less than 10 degrees.  In the past patient is used ibuprofen and also aspirin. Review of Systems possible history of headaches.   Objective: Vital Signs: BP 112/72 (BP Location: Left Arm, Patient Position: Sitting)   Pulse 85   Ht 5' 9.75" (1.772  m)   Wt 137 lb (62.1 kg)   BMI 19.80 kg/m   Physical Exam Constitutional:      Appearance: He is well-developed.  HENT:     Head: Normocephalic and atraumatic.  Eyes:     Pupils: Pupils are equal, round, and reactive to light.  Neck:     Thyroid: No thyromegaly.     Trachea: No tracheal deviation.  Cardiovascular:     Rate and Rhythm: Normal rate.  Pulmonary:     Effort: Pulmonary effort is normal.     Breath sounds: No wheezing.  Abdominal:     General: Bowel sounds are normal.     Palpations: Abdomen is soft.  Skin:    General: Skin is warm and dry.     Capillary Refill: Capillary refill takes less than 2 seconds.  Neurological:     Mental Status: He is alert and oriented to person, place, and time.  Psychiatric:        Behavior: Behavior normal.        Thought Content: Thought content normal.        Judgment: Judgment normal.     Ortho Exam patient has negative straight leg raising right and left at 90 degrees knee and ankle jerk are 1+ and symmetrical.  No rash  over exposed skin.  Lumbar spine shows no midline defects.  He has tenderness to palpation of the paralumbar muscles on the right more than left negative FABER test.  Anterior tib EHL is intact normal heel walking.  He has some tightness hamstrings with forward flexion fingertips to ankle.  Specialty Comments:  No specialty comments available.  Imaging: CLINICAL DATA:  Low back pain  EXAM: LUMBAR SPINE - 2-3 VIEW  COMPARISON:  None.  FINDINGS: Frontal, lateral, and spot lumbosacral lateral images were obtained. There are 5 non-rib-bearing lumbar type vertebral bodies. There is no acute fracture or spondylolisthesis. There is minimal wedging at L2 which may represent residua of old injury. There is mild disc space narrowing at L1-2. Other disc spaces appear unremarkable. No erosive changes.  IMPRESSION: Slight wedging at L2 which does not appear acute and may represent residua of old injury. No  acute appearing fracture. No spondylolisthesis. Disc space narrowing noted at L1-2. Other disc spaces appear unremarkable. No appreciable facet arthropathy.   Electronically Signed   By: Bretta Bang III M.D.         PMFS History: Patient Active Problem List   Diagnosis Date Noted  . Left knee pain 11/04/2014  . Low back pain 11/04/2014   No past medical history on file.  No family history on file.  No past surgical history on file. Social History   Occupational History  . Not on file  Tobacco Use  . Smoking status: Current Every Day Smoker    Packs/day: 0.50    Types: Cigarettes  . Smokeless tobacco: Never Used  Substance and Sexual Activity  . Alcohol use: No    Alcohol/week: 0.0 standard drinks  . Drug use: No  . Sexual activity: Not on file

## 2019-10-01 ENCOUNTER — Other Ambulatory Visit: Payer: Self-pay | Admitting: Nurse Practitioner

## 2019-10-01 ENCOUNTER — Ambulatory Visit
Admission: RE | Admit: 2019-10-01 | Discharge: 2019-10-01 | Disposition: A | Discharge status: Home / Self Care | Payer: Medicaid Other | Source: Ambulatory Visit | Attending: Nurse Practitioner | Admitting: Nurse Practitioner

## 2019-10-01 DIAGNOSIS — R062 Wheezing: Secondary | ICD-10-CM

## 2020-04-01 ENCOUNTER — Ambulatory Visit: Payer: Medicaid Other

## 2020-04-01 ENCOUNTER — Other Ambulatory Visit: Payer: Self-pay

## 2020-04-01 ENCOUNTER — Ambulatory Visit (INDEPENDENT_AMBULATORY_CARE_PROVIDER_SITE_OTHER): Payer: Medicaid Other

## 2020-04-01 ENCOUNTER — Ambulatory Visit
Admission: EM | Admit: 2020-04-01 | Discharge: 2020-04-01 | Disposition: A | Discharge status: Home / Self Care | Payer: Medicaid Other | Attending: Emergency Medicine | Admitting: Emergency Medicine

## 2020-04-01 DIAGNOSIS — S60945A Unspecified superficial injury of left ring finger, initial encounter: Secondary | ICD-10-CM | POA: Diagnosis not present

## 2020-04-01 DIAGNOSIS — W25XXXA Contact with sharp glass, initial encounter: Secondary | ICD-10-CM | POA: Diagnosis not present

## 2020-04-01 DIAGNOSIS — S60459A Superficial foreign body of unspecified finger, initial encounter: Secondary | ICD-10-CM | POA: Diagnosis not present

## 2020-04-01 NOTE — Discharge Instructions (Addendum)
Keep the area clean with warm water and mild soap Apply thin layer of Neosporin dailyn Follow up here or with PCP if symptoms persists Return or go to the ED if you have any new or worsening symptoms increased redness, swelling, pain, nausea, vomiting, fever, chills, etc..Marland Kitchen
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
## 2020-04-01 NOTE — ED Triage Notes (Signed)
Pt presents with glass stuck  in left ring finger

## 2021-07-08 ENCOUNTER — Ambulatory Visit (HOSPITAL_COMMUNITY)
Admission: EM | Admit: 2021-07-08 | Discharge: 2021-07-08 | Disposition: A | Discharge status: Home / Self Care | Payer: Medicaid Other | Attending: Psychiatry | Admitting: Psychiatry

## 2021-07-08 DIAGNOSIS — F515 Nightmare disorder: Secondary | ICD-10-CM | POA: Insufficient documentation

## 2021-07-08 DIAGNOSIS — Z563 Stressful work schedule: Secondary | ICD-10-CM | POA: Insufficient documentation

## 2021-07-08 DIAGNOSIS — F1090 Alcohol use, unspecified, uncomplicated: Secondary | ICD-10-CM | POA: Insufficient documentation

## 2021-07-08 DIAGNOSIS — Z9152 Personal history of nonsuicidal self-harm: Secondary | ICD-10-CM | POA: Insufficient documentation

## 2021-07-08 DIAGNOSIS — R454 Irritability and anger: Secondary | ICD-10-CM | POA: Insufficient documentation

## 2021-07-08 DIAGNOSIS — F439 Reaction to severe stress, unspecified: Secondary | ICD-10-CM | POA: Insufficient documentation

## 2021-07-08 DIAGNOSIS — Z653 Problems related to other legal circumstances: Secondary | ICD-10-CM | POA: Insufficient documentation

## 2021-07-08 DIAGNOSIS — F1290 Cannabis use, unspecified, uncomplicated: Secondary | ICD-10-CM | POA: Insufficient documentation

## 2021-07-08 DIAGNOSIS — F4310 Post-traumatic stress disorder, unspecified: Secondary | ICD-10-CM | POA: Insufficient documentation
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
## 2021-07-08 NOTE — ED Notes (Signed)
Pt discharged with resources in hand for follow up care recommendations. Verbalized understanding of discharge instructions. Safety maintained. ?

## 2021-07-08 NOTE — Discharge Instructions (Addendum)

## 2021-07-27 DIAGNOSIS — F6381 Intermittent explosive disorder: Secondary | ICD-10-CM | POA: Diagnosis not present

## 2021-08-03 DIAGNOSIS — F6381 Intermittent explosive disorder: Secondary | ICD-10-CM | POA: Diagnosis not present

## 2021-08-10 DIAGNOSIS — F6381 Intermittent explosive disorder: Secondary | ICD-10-CM | POA: Diagnosis not present

## 2021-08-17 DIAGNOSIS — F6381 Intermittent explosive disorder: Secondary | ICD-10-CM | POA: Diagnosis not present

## 2021-08-20 ENCOUNTER — Ambulatory Visit (HOSPITAL_COMMUNITY): Payer: Medicaid Other | Admitting: Clinical

## 2021-08-24 DIAGNOSIS — F6381 Intermittent explosive disorder: Secondary | ICD-10-CM | POA: Diagnosis not present

## 2021-08-31 DIAGNOSIS — F6381 Intermittent explosive disorder: Secondary | ICD-10-CM | POA: Diagnosis not present

## 2021-09-07 DIAGNOSIS — F6381 Intermittent explosive disorder: Secondary | ICD-10-CM | POA: Diagnosis not present

## 2021-09-09 DIAGNOSIS — Z23 Encounter for immunization: Secondary | ICD-10-CM | POA: Diagnosis not present

## 2021-09-09 DIAGNOSIS — S60451A Superficial foreign body of left index finger, initial encounter: Secondary | ICD-10-CM | POA: Diagnosis not present

## 2021-09-14 DIAGNOSIS — F6381 Intermittent explosive disorder: Secondary | ICD-10-CM | POA: Diagnosis not present

## 2021-09-19 DIAGNOSIS — M546 Pain in thoracic spine: Secondary | ICD-10-CM | POA: Diagnosis not present

## 2021-09-21 DIAGNOSIS — F6381 Intermittent explosive disorder: Secondary | ICD-10-CM | POA: Diagnosis not present

## 2021-09-28 DIAGNOSIS — F6381 Intermittent explosive disorder: Secondary | ICD-10-CM | POA: Diagnosis not present

## 2021-10-05 DIAGNOSIS — F6381 Intermittent explosive disorder: Secondary | ICD-10-CM | POA: Diagnosis not present

## 2021-10-12 DIAGNOSIS — F6381 Intermittent explosive disorder: Secondary | ICD-10-CM | POA: Diagnosis not present

## 2021-10-26 DIAGNOSIS — F6381 Intermittent explosive disorder: Secondary | ICD-10-CM | POA: Diagnosis not present

## 2021-11-02 DIAGNOSIS — F6381 Intermittent explosive disorder: Secondary | ICD-10-CM | POA: Diagnosis not present

## 2021-11-09 DIAGNOSIS — F6381 Intermittent explosive disorder: Secondary | ICD-10-CM | POA: Diagnosis not present

## 2021-11-16 DIAGNOSIS — F6381 Intermittent explosive disorder: Secondary | ICD-10-CM | POA: Diagnosis not present

## 2021-11-23 DIAGNOSIS — F6381 Intermittent explosive disorder: Secondary | ICD-10-CM | POA: Diagnosis not present

## 2021-11-30 DIAGNOSIS — F6381 Intermittent explosive disorder: Secondary | ICD-10-CM | POA: Diagnosis not present

## 2021-12-07 DIAGNOSIS — F6381 Intermittent explosive disorder: Secondary | ICD-10-CM | POA: Diagnosis not present

## 2021-12-14 DIAGNOSIS — F6381 Intermittent explosive disorder: Secondary | ICD-10-CM | POA: Diagnosis not present

## 2021-12-21 DIAGNOSIS — F6381 Intermittent explosive disorder: Secondary | ICD-10-CM | POA: Diagnosis not present

## 2021-12-28 DIAGNOSIS — F6381 Intermittent explosive disorder: Secondary | ICD-10-CM | POA: Diagnosis not present

## 2022-01-04 DIAGNOSIS — F6381 Intermittent explosive disorder: Secondary | ICD-10-CM | POA: Diagnosis not present

## 2022-01-11 DIAGNOSIS — F6381 Intermittent explosive disorder: Secondary | ICD-10-CM | POA: Diagnosis not present

## 2022-01-18 DIAGNOSIS — F6381 Intermittent explosive disorder: Secondary | ICD-10-CM | POA: Diagnosis not present

## 2022-01-25 DIAGNOSIS — F6381 Intermittent explosive disorder: Secondary | ICD-10-CM | POA: Diagnosis not present

## 2022-02-08 DIAGNOSIS — F6381 Intermittent explosive disorder: Secondary | ICD-10-CM | POA: Diagnosis not present

## 2022-02-15 DIAGNOSIS — F6381 Intermittent explosive disorder: Secondary | ICD-10-CM | POA: Diagnosis not present

## 2022-02-22 DIAGNOSIS — F6381 Intermittent explosive disorder: Secondary | ICD-10-CM | POA: Diagnosis not present

## 2022-02-28 ENCOUNTER — Ambulatory Visit (INDEPENDENT_AMBULATORY_CARE_PROVIDER_SITE_OTHER): Payer: BC Managed Care – PPO

## 2022-02-28 ENCOUNTER — Ambulatory Visit (HOSPITAL_COMMUNITY)
Admission: RE | Admit: 2022-02-28 | Discharge: 2022-02-28 | Disposition: A | Discharge status: Home / Self Care | Payer: BC Managed Care – PPO | Source: Ambulatory Visit | Attending: Internal Medicine | Admitting: Internal Medicine

## 2022-02-28 VITALS — BP 110/80 | HR 80 | Temp 98.0°F | Ht 69.0 in | Wt 158.3 lb

## 2022-02-28 DIAGNOSIS — M545 Low back pain, unspecified: Secondary | ICD-10-CM

## 2022-02-28 DIAGNOSIS — G43709 Chronic migraine without aura, not intractable, without status migrainosus: Secondary | ICD-10-CM | POA: Diagnosis not present

## 2022-02-28 DIAGNOSIS — N469 Male infertility, unspecified: Secondary | ICD-10-CM | POA: Diagnosis not present

## 2022-02-28 DIAGNOSIS — D72819 Decreased white blood cell count, unspecified: Secondary | ICD-10-CM

## 2022-02-28 DIAGNOSIS — G8929 Other chronic pain: Secondary | ICD-10-CM | POA: Diagnosis not present

## 2022-02-28 DIAGNOSIS — G43909 Migraine, unspecified, not intractable, without status migrainosus: Secondary | ICD-10-CM | POA: Insufficient documentation

## 2022-02-28 DIAGNOSIS — L731 Pseudofolliculitis barbae: Secondary | ICD-10-CM

## 2022-02-28 DIAGNOSIS — R6889 Other general symptoms and signs: Secondary | ICD-10-CM

## 2022-02-28 DIAGNOSIS — G43009 Migraine without aura, not intractable, without status migrainosus: Secondary | ICD-10-CM | POA: Diagnosis not present

## 2022-02-28 DIAGNOSIS — M533 Sacrococcygeal disorders, not elsewhere classified: Secondary | ICD-10-CM | POA: Diagnosis not present

## 2022-02-28 MED ORDER — SUMATRIPTAN SUCCINATE 50 MG PO TABS: 50.0000 mg | ORAL_TABLET | ORAL | 0 refills | Status: DC | PRN

## 2022-02-28 NOTE — Patient Instructions (Signed)
Thank you, [name] for allowing [place] to provide your care today. Today we discussed :  Migraines- We have prescribed sumatriptan to help with migraine abortion. Do not exceed 200 mg in a single day. Please keep a headache log so we can determine the type and frequency of headaches. Also write what you are doing to see if we can find triggers. You are also having tension headaches related to your back pain.  Back pain- We will rule out an inflammatory arthritis called ankylosing spondylitis. We will consider further back imaging with CT or MRI based on results.    I have ordered the following medication/changed the following medications:   Stop the following medications: There are no discontinued medications.   Start the following medications: Meds ordered this encounter  Medications   SUMAtriptan (IMITREX) 50 MG tablet    Sig: Take 1 tablet (50 mg total) by mouth every 2 (two) hours as needed for migraine. May repeat in 2 hours if headache persists or recurs.    Dispense:  10 tablet    Refill:  0     Follow up: 3 months     We look forward to seeing you next time. Please call our clinic at [phone] if you have any questions or concerns. The best time to call is Monday-Friday from 9am-4pm, but there is someone available 24/7. If after hours or the weekend, call the main hospital number and ask for the Internal Medicine Resident On-Call. If you need medication refills, please notify your pharmacy one week in advance and they will send [place] a request.   Thank you for trusting me with your care. Wishing you the best!   Reginald Coach, MD [place]

## 2022-02-28 NOTE — Assessment & Plan Note (Addendum)
Residual erythematous papular rash noted on the neck centered around the hair follicles that occurs when the patient shaves with a straight razor. Provided work note so that patient does not have to shave flush with the skin.

## 2022-02-28 NOTE — Assessment & Plan Note (Signed)
Patient has a few episodes in the past of minimal leukopenia. Most recently was minimally decreased to 4.3K. All cell lines are normal and his marrow demonstrates recovery. He has a history of EBV. This is most likely viral suppression and given recover and no other cell line abnormalities will not work this up further. Can continue to monitor on labs in the future.

## 2022-02-28 NOTE — Progress Notes (Deleted)
ED visit 3/16 History includes 12 years ago hosptalized at [facility] at age 26 related to "PTSD and nightmares." He was compliant with Seroquel until age 1.   He has not been prescribed psychotropic medications in approximately 9 years. He is not currently linked with outpatient mental health providers.   Patient has history of nonsuicidal self-harm behavior, history of cutting self.  Last episode of cutting approximately 10 years ago.   Infertility Saw urology at [facility] 01/2021 Plan: 1) T,E2, FSH, LH, 17HP, TSH, CBC-all normal 2) S/A at [facility]  3) RPV first available (not earlier than 3 weeks)    Leukopenia

## 2022-02-28 NOTE — Assessment & Plan Note (Signed)
Patient reports years of migraine headache. He describes these as unilateral, throbbing, with photophobia. He says in the past they occurred multiple times monthly now on average about once per month. Ibuprofen 1000 does not help and he just has to lay in a dark room, but this does not always help either. He describes being on an abortive medication that did not help in the past. He says stress seems to be the main trigger. He also describes other bandlike headaches starting above the eyebrows and radiating across the head associated with back pain, liiely representing tension headaches. Advised patient to keep a headache journal documenting activity at time of onset, quality/quantity of the headache to better elucidate which one is occurring and the total frequency. -sumatriptan 50 mg q2 hours for migraine

## 2022-02-28 NOTE — Assessment & Plan Note (Signed)
Patient describes forgetfulness that has become more prominent and now he cannot remember meals or activities from prior weeks, and often forgets conversations he is having in the middle of the conversation. He does not feel he is depressed as he denies guilt, SI/HI, and doesn't feel he has a depressed mood. He does see a therapist weekly for anger management. Outside of work he says he watches netflix and just doesn't want to do anything else do to cost. He says if it was free he would but it just doesn't make sense to pay for things and gas when netflix is free. He ahs also had trouble sleeping due to pain. He has long standing irritable mood that occurs situationally. Recently his fiance cheated on him with a coworker. He says they were kicked out of their apartment and now both of them live with his father. The patient was worried about early onset alzheimers and requesting imaging. We discussed that even early onset alzheimers does not occur this early and there are no imaging indications at this time. He does have some features of depression but we will continue to watch this and see if it is playing a role in his memory. He denies alcohol, tobacco, drug use. He also had normal TSH, B12, vitamin D 01/2021.

## 2022-02-28 NOTE — Assessment & Plan Note (Signed)
Patient last seen at [facility] urology in 2022 for infertility for 3 years. All estrogen, testosterone, LH, TSH normal at that time. Did not see mention of qualitative or quantitative sperm testing. Patient currently not sexually active following separation between him and his fiance. Can consider additional testing in the future.

## 2022-02-28 NOTE — Assessment & Plan Note (Signed)
Patient describes years of low back that has worsened over the past few months and keeps him up at night. He denies an burning/shooting pain, radiation into the legs, numbness or loss of motor function in the extremities. He was last seen by ortho 07/09/2019 for this where radiographs noted slight wedging of the L2 vertebral body, minimal c5-c6 disc narrowing and minimal scoliosis. Ortho did not feel scoliosis was the cause of pain and felt some of the spinal changes could have been prior injury related. The patient tried going to a chiropractor in the past which worsened the pain but did not see a PT. On exam there is not appreciable abnormal spinal curvature when sitting up or touching toes, no bony ptp or paraspinal muscle tenderness. 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 Based on results will likely proceed to MRI after. Will need to further consider use of PT and short course of muscle relaxers. Patient says he knows PT wont work but will humor [place] and go. He also says muscle relaxer help short term but he doesn't want to get hooked. -Xray SI joints, consider MRI -continue discussing role of PT -consider short course of muscle relaxers

## 2022-02-28 NOTE — Progress Notes (Signed)
New Patient Office Visit  Subjective    Patient ID: Reginald [name], male    DOB: 27-Sep-1995  Age: 26 y.o. MRN: [id number]  CC: No chief complaint on file.   HPI Reginald [name] is a 26 y/o male with a pmh outlined below who presents to establish care. Please see encounter tab for HPI and A/P information.   Outpatient Encounter Medications as of 02/28/2022  Medication Sig   [DISCONTINUED] SUMAtriptan (IMITREX) 50 MG tablet Take 1 tablet (50 mg total) by mouth every 2 (two) hours as needed for migraine. May repeat in 2 hours if headache persists or recurs.   cyclobenzaprine (FLEXERIL) 10 MG tablet Take 1 tablet (10 mg total) by mouth at bedtime and may repeat dose one time if needed.   meloxicam (MOBIC) 15 MG tablet Take 1 tablet (15 mg total) by mouth daily.   methocarbamol (ROBAXIN) 500 MG tablet Take 1 tablet (500 mg total) by mouth daily after breakfast.   SUMAtriptan (IMITREX) 50 MG tablet Take 1 tablet (50 mg total) by mouth every 2 (two) hours as needed for migraine. May repeat in 2 hours if headache persists or recurs.   No facility-administered encounter medications on file as of 02/28/2022.    No past medical history on file.  No past surgical history on file.  No family history on file.  Social History   Socioeconomic History   Marital status: Single    Spouse name: Not on file   Number of children: Not on file   Years of education: Not on file   Highest education level: Not on file  Occupational History   Not on file  Tobacco Use   Smoking status: Former    Packs/day: 0.50    Types: Cigarettes   Smokeless tobacco: Never  Substance and Sexual Activity   Alcohol use: No    Alcohol/week: 0.0 standard drinks of alcohol   Drug use: No   Sexual activity: Not on file  Other Topics Concern   Not on file  Social History Narrative   Not on file   Social Determinants of Health   Financial Resource Strain: Not on file  Food Insecurity: Not on file   Transportation Needs: Not on file  Physical Activity: Not on file  Stress: Not on file  Social Connections: Not on file  Intimate Partner Violence: Not on file    Review of Systems  All other systems reviewed and are negative.       Objective    BP 110/80 (BP Location: Left Arm, Patient Position: Sitting, Cuff Size: Normal)   Pulse 80   Temp 98 F (36.7 C) (Oral)   Ht 5\' 9"  (1.753 m)   Wt 158 lb 4.8 oz (71.8 kg)   SpO2 100%   BMI 23.38 kg/m   Physical Exam Constitutional:      General: He is not in acute distress.    Appearance: Normal appearance. He is normal weight. He is not ill-appearing.  HENT:     Head: Normocephalic and atraumatic.  Eyes:     General: No scleral icterus.    Extraocular Movements: Extraocular movements intact.     Conjunctiva/sclera: Conjunctivae normal.     Pupils: Pupils are equal, round, and reactive to light.  Cardiovascular:     Rate and Rhythm: Normal rate and regular rhythm.     Pulses: Normal pulses.     Heart sounds: Normal heart sounds. No murmur heard.    No gallop.  Pulmonary:  Effort: Pulmonary effort is normal. No respiratory distress.     Breath sounds: Normal breath sounds. No wheezing or rales.  Musculoskeletal:     Cervical back: No rigidity or tenderness.     Right lower leg: No edema.     Left lower leg: No edema.     Comments: No bony spinal ptp or ptp over the paraspinal muscles. No appreciable abnormal spinal curvature when touching toes or sitting upright.  Lymphadenopathy:     Cervical: No cervical adenopathy.  Skin:    General: Skin is warm and dry.     Capillary Refill: Capillary refill takes less than 2 seconds.     Comments: Erythematous papular rash around hair follicles of the anterior neck  Neurological:     Mental Status: He is alert.     Sensory: No sensory deficit.     Motor: No weakness.     Gait: Gait normal.         Assessment & Plan:   Problem List Items Addressed This Visit        Cardiovascular and Mediastinum   Migraine    Patient reports years of migraine headache. He describes these as unilateral, throbbing, with photophobia. He says in the past they occurred multiple times monthly now on average about once per month. Ibuprofen 1000 does not help and he just has to lay in a dark room, but this does not always help either. He describes being on an abortive medication that did not help in the past. He says stress seems to be the main trigger. He also describes other bandlike headaches starting above the eyebrows and radiating across the head associated with back pain, liiely representing tension headaches. Advised patient to keep a headache journal documenting activity at time of onset, quality/quantity of the headache to better elucidate which one is occurring and the total frequency. -sumatriptan 50 mg q2 hours for migraine       Relevant Medications   SUMAtriptan (IMITREX) 50 MG tablet     Musculoskeletal and Integument   Pseudofolliculitis barbae    Residual erythematous papular rash noted on the neck centered around the hair follicles that occurs when the patient shaves with a straight razor. Provided work note so that patient does not have to shave flush with the skin.        Genitourinary   Infertility male    Patient last seen at [facility] urology in 2022 for infertility for 3 years. All estrogen, testosterone, LH, TSH normal at that time. Did not see mention of qualitative or quantitative sperm testing. Patient currently not sexually active following separation between him and his fiance. Can consider additional testing in the future.        Other   Low back pain - Primary    Patient describes years of low back that has worsened over the past few months and keeps him up at night. He denies an burning/shooting pain, radiation into the legs, numbness or loss of motor function in the extremities. He was last seen by ortho 07/09/2019 for this where radiographs noted  slight wedging of the L2 vertebral body, minimal c5-c6 disc narrowing and minimal scoliosis. Ortho did not feel scoliosis was the cause of pain and felt some of the spinal changes could have been prior injury related. The patient tried going to a chiropractor in the past which worsened the pain but did not see a PT. On exam there is not appreciable abnormal spinal curvature when sitting up or  touching toes, no bony ptp or paraspinal muscle tenderness. 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 Based on results will likely proceed to MRI after. Will need to further consider use of PT and short course of muscle relaxers. Patient says he knows PT wont work but will humor [place] and go. He also says muscle relaxer help short term but he doesn't want to get hooked. -Xray SI joints, consider MRI -continue discussing role of PT -consider short course of muscle relaxers      Relevant Orders   DG Si Joints   Forgetfulness    Patient describes forgetfulness that has become more prominent and now he cannot remember meals or activities from prior weeks, and often forgets conversations he is having in the middle of the conversation. He does not feel he is depressed as he denies guilt, SI/HI, and doesn't feel he has a depressed mood. He does see a therapist weekly for anger management. Outside of work he says he watches netflix and just doesn't want to do anything else do to cost. He says if it was free he would but it just doesn't make sense to pay for things and gas when netflix is free. He ahs also had trouble sleeping due to pain. He has long standing irritable mood that occurs situationally. Recently his fiance cheated on him with a coworker. He says they were kicked out of their apartment and now both of them live with his father. The patient was worried about  early onset alzheimers and requesting imaging. We discussed that even early onset alzheimers does not occur this early and there are no imaging indications at this time. He does have some features of depression but we will continue to watch this and see if it is playing a role in his memory. He denies alcohol, tobacco, drug use. He also had normal TSH, B12, vitamin D 01/2021.      Leukopenia    Patient has a few episodes in the past of minimal leukopenia. Most recently was minimally decreased to 4.3K. All cell lines are normal and his marrow demonstrates recovery. He has a history of EBV. This is most likely viral suppression and given recover and no other cell line abnormalities will not work this up further. Can continue to monitor on labs in the future.      Other Visit Diagnoses     Chronic migraine w/o aura w/o status migrainosus, not intractable       Relevant Medications   SUMAtriptan (IMITREX) 50 MG tablet       Return in about 3 months (around 05/31/2022).   Willette Cluster, MD

## 2022-03-01 DIAGNOSIS — F6381 Intermittent explosive disorder: Secondary | ICD-10-CM | POA: Diagnosis not present

## 2022-03-04 NOTE — Progress Notes (Signed)
Internal Medicine Clinic Attending  I saw and evaluated the patient.  I personally confirmed the key portions of the history and exam documented by the resident  and I reviewed pertinent patient test results.  The assessment, diagnosis, and plan were formulated together and I agree with the documentation in the resident's note.  

## 2022-03-08 DIAGNOSIS — F6381 Intermittent explosive disorder: Secondary | ICD-10-CM | POA: Diagnosis not present

## 2022-03-09 NOTE — Progress Notes (Signed)
Called patient to discuss no evidence of sacroiliitis on xray to suggest ankylosing spondylitis. Asked patient about 77 core features for which he answered: Inflammatory back pain(worse at night, improves with exercise)-yes Heel pain-no Dactylitis-no Uveitis-no Positive family history of SpA-Not aware IBD-No Alternating buttock pain-no Psoriasis-no Positive response to NSAIDS-no Asymmetric arthritis-no ESR or CRP- need to evaluate  Given that even with a positive ESR/CRP this patient would have <4 features, recommend HLA-B27 and would get MRI if positive. Also offered the patient PT to help. He is not wanting pain meds or muscle relaxers as these are patches and temporizers and not a fix. He feels his scoliosis is the problem. He had radiographs in the past with cervical scoliosis and cobb angle around 10 per my measurement, commented to be likely from muscle spasm. He has minimal upper thoracic dextroscoliosis and the only lumbar changes are slight wedging at L2 and minor space narrowing noted at L1-2, for which PT and conservative pain management are the treatment options. He saw Dr. Lorin Mercy with ortho 07/09/2019 and Dr. Lorin Mercy did not feel scoliosis was the source of the pain and recommended PT at that time which was not completed. The patient says he knows scoliosis is the pain source. I explained that given his low cobb angle conservative management and PT are the treatment, otherwise there is nothing to offer the patient as a fix for his scoliosis from a primary care standpoint. Explained that cobb angles >50 are candidates for surgery and he was not deemed a candidate by ortho. He says the ortho doctor was not able to see the radiographs to accurately assess his scoliosis. At this time the patient is amenable to PT and to completing the ankylosing spondylitis work up as above. I will help facilitate seeing orthopedics again for a second opinion.

## 2022-03-09 NOTE — Addendum Note (Signed)
Addended by: Willette Cluster on: 03/09/2022 10:48 AM   Modules accepted: Orders

## 2022-03-15 DIAGNOSIS — F6381 Intermittent explosive disorder: Secondary | ICD-10-CM | POA: Diagnosis not present

## 2022-03-29 DIAGNOSIS — F6381 Intermittent explosive disorder: Secondary | ICD-10-CM | POA: Diagnosis not present

## 2022-04-12 DIAGNOSIS — F6381 Intermittent explosive disorder: Secondary | ICD-10-CM | POA: Diagnosis not present

## 2022-04-26 DIAGNOSIS — F6381 Intermittent explosive disorder: Secondary | ICD-10-CM | POA: Diagnosis not present

## 2022-05-01 IMAGING — DX DG FINGER RING 2+V*L*
3 series · 3 of 3 positions shown · non-contrast
Comparison: None.

CLINICAL DATA: Foreign body.  Concern for glass foreign body.

EXAM:
LEFT RING FINGER 2+V

[finger pa]
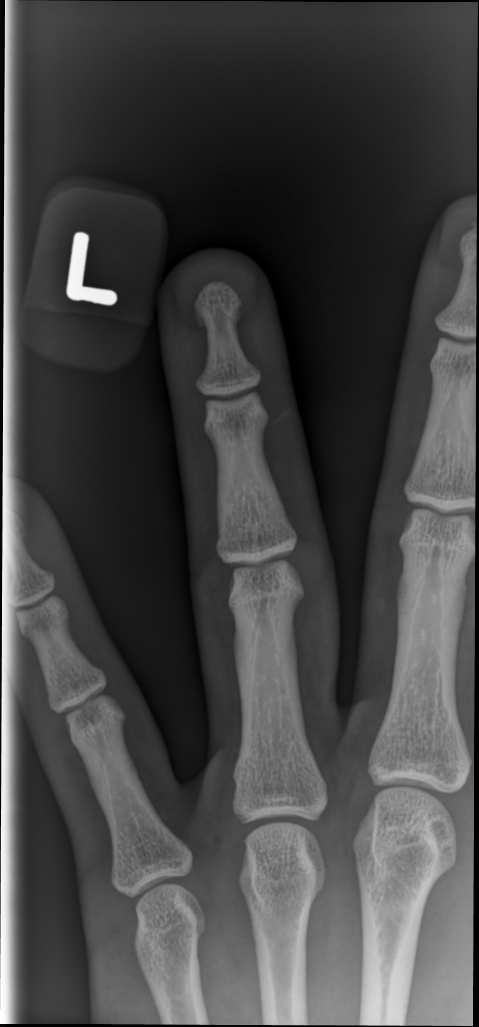

[finger mlo]
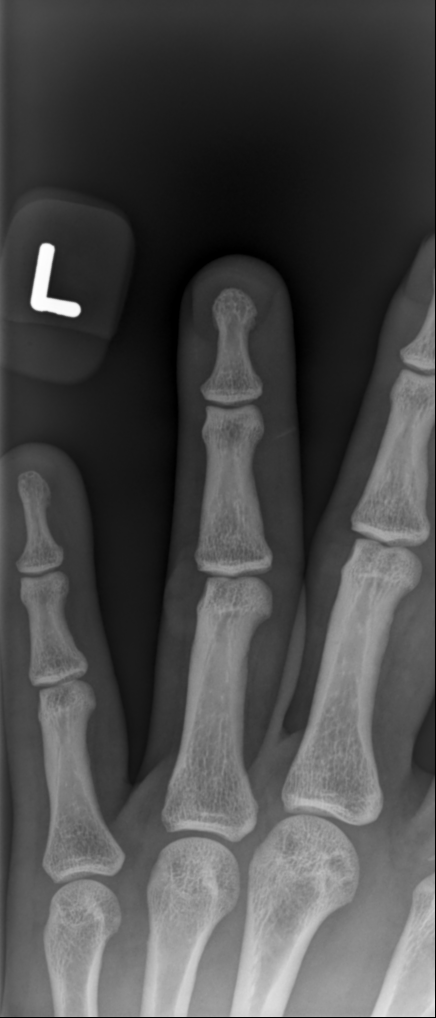

[2. finger lat]
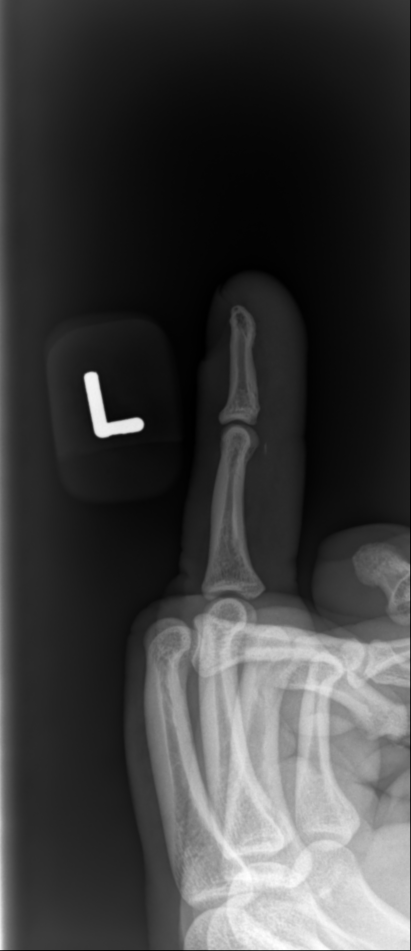

[3 of 3 positions shown; findings below may reference images not displayed]

FINDINGS: There is no evidence of fracture or dislocation. Approximately
cm linear radiopaque foreign body within the subcutaneous soft
tissues along the radial and volar aspect of the distal fourth
middle phalanx. Associated surrounding soft tissue swelling.
IMPRESSION: 1. Approximately 3.5 cm linear radiopaque foreign body within the
subcutaneous soft tissues along the radial and volar aspect of the
distal fourth middle phalanx, compatible with reported glass foreign
body.
2. No acute fracture or dislocation.

## 2022-05-10 DIAGNOSIS — F6381 Intermittent explosive disorder: Secondary | ICD-10-CM | POA: Diagnosis not present

## 2022-05-24 DIAGNOSIS — F6381 Intermittent explosive disorder: Secondary | ICD-10-CM | POA: Diagnosis not present

## 2022-06-07 DIAGNOSIS — F6381 Intermittent explosive disorder: Secondary | ICD-10-CM | POA: Diagnosis not present

## 2022-06-21 DIAGNOSIS — F6381 Intermittent explosive disorder: Secondary | ICD-10-CM | POA: Diagnosis not present

## 2022-07-05 DIAGNOSIS — F6381 Intermittent explosive disorder: Secondary | ICD-10-CM | POA: Diagnosis not present

## 2022-07-19 DIAGNOSIS — F6381 Intermittent explosive disorder: Secondary | ICD-10-CM | POA: Diagnosis not present

## 2022-08-16 DIAGNOSIS — F6381 Intermittent explosive disorder: Secondary | ICD-10-CM | POA: Diagnosis not present

## 2022-08-17 DIAGNOSIS — S20162A Insect bite (nonvenomous) of breast, left breast, initial encounter: Secondary | ICD-10-CM | POA: Diagnosis not present

## 2022-08-17 DIAGNOSIS — B353 Tinea pedis: Secondary | ICD-10-CM | POA: Diagnosis not present

## 2022-08-17 DIAGNOSIS — J302 Other seasonal allergic rhinitis: Secondary | ICD-10-CM | POA: Diagnosis not present

## 2022-08-17 DIAGNOSIS — Z7251 High risk heterosexual behavior: Secondary | ICD-10-CM | POA: Diagnosis not present

## 2022-09-13 DIAGNOSIS — F6381 Intermittent explosive disorder: Secondary | ICD-10-CM | POA: Diagnosis not present

## 2022-09-27 DIAGNOSIS — F6381 Intermittent explosive disorder: Secondary | ICD-10-CM | POA: Diagnosis not present

## 2023-10-26 ENCOUNTER — Ambulatory Visit: Payer: Self-pay | Admitting: Internal Medicine

## 2023-10-26 ENCOUNTER — Telehealth: Payer: Self-pay | Admitting: Sleep Medicine

## 2023-10-26 ENCOUNTER — Encounter: Payer: Self-pay | Admitting: Internal Medicine

## 2023-10-26 VITALS — BP 112/60 | HR 86 | Temp 98.7°F | Ht 69.0 in | Wt 137.2 lb

## 2023-10-26 DIAGNOSIS — J45909 Unspecified asthma, uncomplicated: Secondary | ICD-10-CM

## 2023-10-26 DIAGNOSIS — J4520 Mild intermittent asthma, uncomplicated: Secondary | ICD-10-CM

## 2023-10-26 DIAGNOSIS — F17210 Nicotine dependence, cigarettes, uncomplicated: Secondary | ICD-10-CM

## 2023-10-26 LAB — NITRIC OXIDE: Nitric Oxide: 7

## 2023-10-26 NOTE — Progress Notes (Signed)
 [facility] Pulmonary Medicine Consultation      Date: 10/26/2023,   MRN# [id number] Reily Ilic [birth year]      CHIEF COMPLAINT:   Assessment for asthma   HISTORY OF PRESENT ILLNESS  28 year old pleasant white male seen today for assessment of asthma  Patient claims he was diagnosed with asthma at the age of 78 At this time patient does not use maintenance therapy but does use Symbicort as needed Also uses albuterol as needed  No significant triggers noted at this time Patient lives at home hardwood floors with the dog  Patient had 2 urgent care visits in the last 10 years No prior history of COVID  Patient does smoke half pack a day  No exacerbation at this time No evidence of heart failure at this time No evidence or signs of infection at this time No respiratory distress No fevers, chills, nausea, vomiting, diarrhea No evidence of lower extremity edema No evidence hemoptysis  Patient is applying for jobs in the [employer] therefore is here to evaluate his asthma   Assessment of ASTHMA FeNO  7  ppb-Elevated exhaled Nitric oxide testing is NOT  consistent with type II inflammation    PAST MEDICAL HISTORY   REACTIVE AIRWAYS DISEASE  SURGICAL HISTORY   No past surgical history on file.   FAMILY HISTORY   No family history on file.   SOCIAL HISTORY   Social History   Tobacco Use   Smoking status: Former    Current packs/day: 0.50    Types: Cigarettes   Smokeless tobacco: Never  Substance Use Topics   Alcohol use: No    Alcohol/week: 0.0 standard drinks of alcohol   Drug use: No     MEDICATIONS    Home Medication:  Current Outpatient Rx   Order #: [id number] Class: Normal   Order #: [id number] Class: Normal   Order #: [id number] Class: Normal   Order #: [id number] Class: Normal    Current Medication:  Current Outpatient Medications:    cyclobenzaprine  (FLEXERIL ) 10 MG tablet, Take 1 tablet (10 mg total) by mouth at bedtime and may repeat  dose one time if needed., Disp: 20 tablet, Rfl: 0   meloxicam  (MOBIC ) 15 MG tablet, Take 1 tablet (15 mg total) by mouth daily., Disp: 30 tablet, Rfl: 0   methocarbamol  (ROBAXIN ) 500 MG tablet, Take 1 tablet (500 mg total) by mouth daily after breakfast., Disp: 10 tablet, Rfl: 0   SUMAtriptan  (IMITREX ) 50 MG tablet, Take 1 tablet (50 mg total) by mouth every 2 (two) hours as needed for migraine. May repeat in 2 hours if headache persists or recurs., Disp: 10 tablet, Rfl: 0    ALLERGIES   Patient has no known allergies.  BP 112/60 (BP Location: Right Arm, Patient Position: Sitting, Cuff Size: Large)   Pulse 86   Temp 98.7 F (37.1 C) (Oral)   Ht 5' 9 (1.753 m)   Wt 137 lb 3.2 oz (62.2 kg)   SpO2 98%   BMI 20.26 kg/m   Review of Systems: Gen:  Denies  fever, sweats, chills weight loss  HEENT: Denies blurred vision, double vision, ear pain, eye pain, hearing loss, nose bleeds, sore throat Cardiac:  No dizziness, chest pain or heaviness, chest tightness,edema, No JVD Resp:   No cough, -sputum production, -shortness of breath,-wheezing, -hemoptysis,  Other:  All other systems negative   Physical Examination:   General Appearance: No distress  EYES PERRLA, EOM intact.   NECK Supple, No JVD Pulmonary: normal breath sounds, No  wheezing.  CardiovascularNormal S1,S2.  No m/r/g.   Abdomen: Benign, Soft, non-tender. Neurology UE/LE 5/5 strength, no focal deficits Ext pulses intact, cap refill intact ALL OTHER ROS ARE NEGATIVE        ASSESSMENT/PLAN   28 year old pleasant white male seen today for assessment of asthma  Assessment for asthma Recommend methacholine challenge test Continue to use Symbicort and albuterol as needed Avoid Allergens and Irritants Avoid secondhand smoke Avoid SICK contacts Recommend  Masking  when appropriate Recommend Keep up-to-date with vaccinations  No exacerbation at this time No evidence of heart failure at this time No evidence or  signs of infection at this time No respiratory distress No fevers, chills, nausea, vomiting, diarrhea No evidence of lower extremity edema No evidence hemoptysis No indication for antibiotics or prednisone at this time  Allergic rhinitis Continue over-the-counter medications No significant issues at this time  Smoking Assessment and Cessation Counseling Upon further questioning, Patient smokes 1/2 ppd I have advised patient to quit/stop smoking as soon as possible due to high risk for multiple medical problems Patient is NOT willing to quit smoking I have advised patient that we can assist and have options of Nicotine replacement therapy. I also advised patient on behavioral therapy and can provide oral medication therapy in conjunction with the other therapies Follow up next Office visit  for assessment of smoking cessation Smoking cessation counseling advised for >10  minutes    MEDICATION ADJUSTMENTS/LABS AND TESTS ORDERED:    CURRENT MEDICATIONS REVIEWED AT LENGTH WITH PATIENT TODAY   Patient  satisfied with Plan of action and management. All questions answered   Follow up 2 months after MCT   I spent a total of 61 minutes dedicated to the care of this patient on the date of this encounter to include pre-visit review of records, face-to-face time with the patient discussing conditions above, post visit ordering of testing, clinical documentation with the electronic health record, making appropriate referrals as documented, and communicating necessary information to the patient's healthcare team.    The Patient requires high complexity decision making for assessment and support, frequent evaluation and titration of therapies, application of advanced monitoring technologies and extensive interpretation of multiple databases.  Patient satisfied with Plan of action and management. All questions answered    Nickolas Alm Cellar, M.D.  [facility]   Medical Director [facility] Medical Director [facility] Cardio-Pulmonary Department

## 2023-10-26 NOTE — Telephone Encounter (Signed)
 LVMTCB to schedule sleep consult.

## 2023-10-26 NOTE — Patient Instructions (Signed)
 Recommend methacholine challenge test to assess for asthma Continue Symbicort as needed  Avoid Allergens and Irritants Avoid secondhand smoke Avoid SICK contacts Recommend Keep up-to-date with vaccinations

## 2023-11-03 ENCOUNTER — Other Ambulatory Visit: Payer: Self-pay

## 2023-11-03 DIAGNOSIS — J4520 Mild intermittent asthma, uncomplicated: Secondary | ICD-10-CM

## 2023-11-03 DIAGNOSIS — J45909 Unspecified asthma, uncomplicated: Secondary | ICD-10-CM

## 2023-11-28 ENCOUNTER — Ambulatory Visit (INDEPENDENT_AMBULATORY_CARE_PROVIDER_SITE_OTHER): Payer: Self-pay

## 2023-11-28 DIAGNOSIS — J45909 Unspecified asthma, uncomplicated: Secondary | ICD-10-CM

## 2023-11-28 DIAGNOSIS — J4520 Mild intermittent asthma, uncomplicated: Secondary | ICD-10-CM

## 2023-11-28 LAB — PULMONARY FUNCTION TEST
DL/VA % pred: 83 %
DL/VA: 4.16 ml/min/mmHg/L
DLCO cor % pred: 87 %
DLCO cor: 27.93 ml/min/mmHg
DLCO unc % pred: 87 %
DLCO unc: 27.93 ml/min/mmHg
FEF 25-75 Post: 5.62 L/s
FEF 25-75 Pre: 3.45 L/s
FEF2575-%Change-Post: 62 %
FEF2575-%Pred-Post: 126 %
FEF2575-%Pred-Pre: 77 %
FEV1-%Change-Post: 9 %
FEV1-%Pred-Post: 99 %
FEV1-%Pred-Pre: 90 %
FEV1-Post: 4.34 L
FEV1-Pre: 3.97 L
FEV1FVC-%Change-Post: 3 %
FEV1FVC-%Pred-Pre: 98 %
FEV6-%Change-Post: 4 %
FEV6-%Pred-Post: 97 %
FEV6-%Pred-Pre: 93 %
FEV6-Post: 5.15 L
FEV6-Pre: 4.92 L
FEV6FVC-%Change-Post: 0 %
FEV6FVC-%Pred-Post: 100 %
FEV6FVC-%Pred-Pre: 101 %
FVC-%Change-Post: 6 %
FVC-%Pred-Post: 97 %
FVC-%Pred-Pre: 92 %
FVC-Post: 5.22 L
FVC-Pre: 4.92 L
Post FEV1/FVC ratio: 83 %
Post FEV6/FVC ratio: 100 %
Pre FEV1/FVC ratio: 81 %
Pre FEV6/FVC Ratio: 100 %
RV % pred: 181 %
RV: 2.81 L
TLC % pred: 113 %
TLC: 7.58 L

## 2023-11-28 NOTE — Patient Instructions (Signed)
 Full PFT performed today.

## 2023-11-28 NOTE — Progress Notes (Signed)
 Full PFT performed today.

## 2023-11-29 ENCOUNTER — Ambulatory Visit: Payer: Self-pay

## 2023-11-29 NOTE — Telephone Encounter (Signed)
 FYI Only or Action Required?: FYI only for provider.  Patient was last seen in primary care on 02/28/2022 by Sharl Gee, MD.  Called Nurse Triage reporting Night Sweats.  Symptoms began several weeks ago.  Interventions attempted: Nothing.  Symptoms are: unchanged.  Triage Disposition: See Physician Within 24 Hours  Patient/caregiver understands and will follow disposition?: Yes, will follow disposition  Copied from CRM [id number]. Topic: Clinical - Red Word Triage >> Nov 29, 2023  4:33 PM [place] E wrote: [facility] that prompted transfer to Nurse Triage: Severe night sweats, been going on for a couple weeks. Patient is getting 2-3 hours of sleep a night. Reason for Disposition  [1] SEVERE sweating (e.g., drenched with sweat) AND [2] cause unknown  Answer Assessment - Initial Assessment Questions 1. ONSET: When did the sweating start?      2 wks 2. LOCATION: What part of your body has excessive sweating? (e.g., entire body; just face, underarms, palms, or soles of feet).      everywhere 3. SEVERITY: How bad is the sweating?    (Scale 1-10; or mild, moderate, severe)     Severe, pouring sweat even when under fan, during the night 4. CAUSE: What do you think is causing the sweating?     unsure 5. FEVER: Have you been having fevers? If Yes, ask: What is your temperature, how was it measured, and when did it start?     denies 6. OTHER SYMPTOMS: Do you have any other symptoms? 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  Protocols used: [facility]

## 2023-11-30 ENCOUNTER — Ambulatory Visit: Payer: Self-pay

## 2023-11-30 ENCOUNTER — Other Ambulatory Visit: Payer: Self-pay

## 2023-11-30 VITALS — BP 109/70 | HR 109 | Temp 98.1°F | Ht 69.0 in | Wt 126.2 lb

## 2023-11-30 DIAGNOSIS — F17210 Nicotine dependence, cigarettes, uncomplicated: Secondary | ICD-10-CM

## 2023-11-30 DIAGNOSIS — G8929 Other chronic pain: Secondary | ICD-10-CM

## 2023-11-30 DIAGNOSIS — R61 Generalized hyperhidrosis: Secondary | ICD-10-CM

## 2023-11-30 DIAGNOSIS — M545 Low back pain, unspecified: Secondary | ICD-10-CM

## 2023-11-30 DIAGNOSIS — Z720 Tobacco use: Secondary | ICD-10-CM | POA: Insufficient documentation

## 2023-11-30 NOTE — Assessment & Plan Note (Addendum)
 Patient presents with a one month history of night sweats, acutely worsening over the past 2 weeks. He states that he is drenching the bed every night and has to get out of bed to stand on the [facility] register. Symptoms have persisted despite environmental modifications.  Has been having trouble sleeping.  Associated symptoms of 20 pound earlier this year, decreased appetite, diarrhea, irritability, pruritus, heat intolerance, and fatigue. He also has concerns over word finding difficulties and memory loss and I think these are probably due to his lack of sleep that he has been having. He also has a history of asthma which has worsened somewhat over the last couple of months, although he has only been using his Albuterol <3x per week. He has no history of recent travel or animal exposures. He has not noticed any lumps. He has no history of cocaine or IV drug use. He has not had any accidental needle sticks. No history of STIs and has only been sexually active with one partner in the last year.  His physical exam was benign except for tachycardia.  Not sure at this point what is causing his night sweats.  Differential includes hyperthyroidism, tuberculosis, lymphoma/leukemia, and HIV.  He has history of irritability, heat intolerance, tachycardia, weight loss, and diarrhea could suggest a hyperthyroidism picture.  However he has had decreased appetite over this time.  He has had a positive TB spot in the past, but we do not have records of this. He does have a history of EBV and some pruritus which could point to lymphoma.  -TSH and T4 - CBC with differential - CMP with GFR - HIV antibody with reflex - LDH - Quantiferon gold - Will arrange follow-up pending the results of these tests.  Patient is also starting a new job and would like to see what his schedule is like first.

## 2023-11-30 NOTE — Patient Instructions (Addendum)
 Thank you, [name], for allowing us  to provide your care today. Today we discussed . . .  > Night sweats       - I am unsure what the cause of this is at this time. I would like to rule out TB, HIV, Hyperthyroidism and lymphoma so I have ordered labs to evaluate for these. I will call you with these lab results > Smoking       - Keep up the good work with cutting back on your smoking > Back pain       - We did not have enough time to discuss this today but we can address this at a later visit.   I have ordered the following labs for you:  Lab Orders         TSH         CBC with Diff         LDH         HIV antibody (with reflex)         QuantiFERON-TB Gold Plus         T4, Free         Comprehensive metabolic panel with GFR        Follow up: depending on the lab results   Remember:  Should you have any questions or concerns please call the internal medicine clinic at [phone].     Melvenia Morrison, [facility]

## 2023-11-30 NOTE — Assessment & Plan Note (Signed)
 Patient has history of chronic back pain has been going on for several years.  He has been told by 1 doctor that he has scoliosis and another 1 that did not.  He is taking over-the-counter analgesics for this in the past. He has not gone to physical therapy as he has been doing a rigorous home exercise program.  Inadequate time to address this concern at today's visit.  Will address this at a later visit.

## 2023-11-30 NOTE — Telephone Encounter (Signed)
 Received transferred call from E[facility] [name] Pt wants to know if he will have to pay a copay for today's visit CMA informed pt that he can still be seen today, but he will be billed for today's visit.   Pt states he can't pay anything today, but agrees to come in for visit..Reginald Fabela Cassady8/7/202510:08 AM

## 2023-11-30 NOTE — Progress Notes (Signed)
 CC: Acute Concern of night sweats  HPI:  Reginald [name] is a 28 y.o. male with pertinent PMH of EBV, ADHD who presents for chief concern of night sweats. Please see problem based assessment and plan for further history.     Review of Systems  Constitutional:  Positive for chills, diaphoresis, malaise/fatigue and weight loss. Negative for fever.  Eyes:  Negative for blurred vision and double vision.  Respiratory:  Positive for cough and shortness of breath.   Cardiovascular:  Negative for chest pain, palpitations and leg swelling.  Gastrointestinal:  Positive for diarrhea. Negative for constipation, heartburn, nausea and vomiting.  Genitourinary:  Negative for dysuria.  Musculoskeletal:  Positive for back pain.  Skin:  Positive for itching and rash.  Neurological:  Positive for tremors. Negative for speech change and headaches.       Fasiculations  Psychiatric/Behavioral:  Negative for depression, hallucinations and suicidal ideas. The patient has insomnia. The patient is not nervous/anxious.     Medications: Current Outpatient Medications  Medication Instructions   albuterol (VENTOLIN HFA) 108 (90 Base) MCG/ACT inhaler 1-2 puffs, Every 4 hours PRN     Physical Exam:  Vitals:   11/30/23 1438  BP: 109/70  Pulse: (!) 109  Temp: 98.1 F (36.7 C)  TempSrc: Oral  SpO2: 98%  Weight: 126 lb 3.2 oz (57.2 kg)  Height: 5' 9 (1.753 m)    Physical Exam Constitutional:      General: He is not in acute distress.    Appearance: He is not ill-appearing.  HENT:     Head: Normocephalic.     Mouth/Throat:     Comments: No thrush  Eyes:     General: No scleral icterus.    Extraocular Movements: Extraocular movements intact.     Pupils: Pupils are equal, round, and reactive to light.  Neck:     Comments: Fullness of the right sided neck without thyromegaly. No tenderness. No lymphadenopathy  Cardiovascular:     Rate and Rhythm: Regular rhythm. Tachycardia present.      Heart sounds: No murmur heard.    No friction rub. No gallop.  Pulmonary:     Effort: No respiratory distress.     Breath sounds: Normal breath sounds. No wheezing, rhonchi or rales.  Abdominal:     General: Abdomen is flat. There is no distension.     Palpations: There is no mass.     Tenderness: There is no abdominal tenderness. There is no guarding.  Musculoskeletal:     Right lower leg: No edema.     Left lower leg: No edema.  Skin:    Coloration: Skin is not jaundiced.     Comments: No lymphadenopathy in the cervical region, clavicles, axilla, or groin  Neurological:     Mental Status: He is alert.       Assessment & Plan:   Assessment & Plan Unexplained night sweats Patient presents with a one month history of night sweats, acutely worsening over the past 2 weeks. He states that he is drenching the bed every night and has to get out of bed to stand on the [facility] register. Symptoms have persisted despite environmental modifications.  Has been having trouble sleeping.  Associated symptoms of 20 pound earlier this year, decreased appetite, diarrhea, irritability, pruritus, heat intolerance, and fatigue. He also has concerns over word finding difficulties and memory loss and I think these are probably due to his lack of sleep that he has been having. He also has  a history of asthma which has worsened somewhat over the last couple of months, although he has only been using his Albuterol <3x per week. He has no history of recent travel or animal exposures. He has not noticed any lumps. He has no history of cocaine or IV drug use. He has not had any accidental needle sticks. No history of STIs and has only been sexually active with one partner in the last year.  His physical exam was benign except for tachycardia.  Not sure at this point what is causing his night sweats.  Differential includes hyperthyroidism, tuberculosis, lymphoma/leukemia, and HIV.  He has history of irritability, heat  intolerance, tachycardia, weight loss, and diarrhea could suggest a hyperthyroidism picture.  However he has had decreased appetite over this time.  He has had a positive TB spot in the past, but we do not have records of this. He does have a history of EBV and some pruritus which could point to lymphoma.  -TSH and T4 - CBC with differential - CMP with GFR - HIV antibody with reflex - LDH - Quantiferon gold - Will arrange follow-up pending the results of these tests.  Patient is also starting a new job and would like to see what his schedule is like first.  Chronic bilateral low back pain without sciatica Patient has history of chronic back pain has been going on for several years.  He has been told by 1 doctor that he has scoliosis and another 1 that did not.  He is taking over-the-counter analgesics for this in the past. He has not gone to physical therapy as he has been doing a rigorous home exercise program.  Inadequate time to address this concern at today's visit.  Will address this at a later visit. Tobacco use Patient currently smoking 5 cigarettes/day, would like to continue to cut down with an eventual quit date of March of next year around his birthday.  He was provided encouragement regarding this goal   Patient seen with Dr. Ronnald Karna Melvenia Napoleon, MD [facility] Resident PGY-1 Clinic Phone: [phone] Please contact the on call pager at [phone] for any urgent or emergent needs.

## 2023-11-30 NOTE — Assessment & Plan Note (Signed)
 Patient currently smoking 5 cigarettes/day, would like to continue to cut down with an eventual quit date of March of next year around his birthday.  He was provided encouragement regarding this goal

## 2023-11-30 NOTE — Telephone Encounter (Signed)
 RTC to patient states that he knows that he will need lab work to determine the cause of the hot flashes.  Does not have insurance. Has contacted an office and knows that he will need to come up with a co-pay.  Also knows if he goes to an Urgent Care he will need to pay up front as well.  Patient states that he will go by a [facility] to see if he can get the labs done without charge.  Also stated that he may not keep the appointment today.  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  Asked patient o give us  a call if he decides not to keep the appointment.

## 2023-12-01 ENCOUNTER — Ambulatory Visit: Payer: Self-pay

## 2023-12-01 NOTE — Progress Notes (Signed)
 [facility] Attending  I was physically present during the key portions of the resident provided service and participated in the medical decision making of patient's management care. I reviewed pertinent patient test results.  The assessment, diagnosis, and plan were formulated together and I agree with the documentation in the resident's note.  Dickie La, MD

## 2023-12-04 LAB — QUANTIFERON-TB GOLD PLUS
QuantiFERON Mitogen Value: >10 [IU]/mL
QuantiFERON Nil Value: 0.01 [IU]/mL
QuantiFERON TB1 Ag Value: 0.01 [IU]/mL
QuantiFERON TB2 Ag Value: 0.01 [IU]/mL

## 2023-12-04 LAB — CBC WITH DIFFERENTIAL/PLATELET
Basophils Absolute: 0.1 x10E3/uL (ref 0.0–0.2)
Basos: 1 %
EOS (ABSOLUTE): 0.2 x10E3/uL (ref 0.0–0.4)
Eos: 2 %
Hematocrit: 45.8 % (ref 37.5–51.0)
Hemoglobin: 15.3 g/dL (ref 13.0–17.7)
Immature Grans (Abs): 0 x10E3/uL (ref 0.0–0.1)
Immature Granulocytes: 0 %
Lymphocytes Absolute: 1.6 x10E3/uL (ref 0.7–3.1)
Lymphs: 21 %
MCH: 30.6 pg (ref 26.6–33.0)
MCHC: 33.4 g/dL (ref 31.5–35.7)
MCV: 92 fL (ref 79–97)
Monocytes Absolute: 0.4 x10E3/uL (ref 0.1–0.9)
Monocytes: 5 %
Neutrophils Absolute: 5.2 x10E3/uL (ref 1.4–7.0)
Neutrophils: 71 %
Platelets: 213 x10E3/uL (ref 150–450)
RBC: 5 x10E6/uL (ref 4.14–5.80)
RDW: 13.3 % (ref 11.6–15.4)
WBC: 7.4 x10E3/uL (ref 3.4–10.8)

## 2023-12-04 LAB — COMPREHENSIVE METABOLIC PANEL WITH GFR
ALT: 14 IU/L (ref 0–44)
AST: 17 IU/L (ref 0–40)
Albumin: 4.7 g/dL (ref 4.3–5.2)
Alkaline Phosphatase: 69 IU/L (ref 44–121)
BUN/Creatinine Ratio: 16 (ref 9–20)
BUN: 17 mg/dL (ref 6–20)
Bilirubin Total: 0.4 mg/dL (ref 0.0–1.2)
CO2: 21 mmol/L (ref 20–29)
Calcium: 9.8 mg/dL (ref 8.7–10.2)
Chloride: 104 mmol/L (ref 96–106)
Creatinine, Ser: 1.05 mg/dL (ref 0.76–1.27)
Globulin, Total: 2.2 g/dL (ref 1.5–4.5)
Glucose: 75 mg/dL (ref 70–99)
Potassium: 4.5 mmol/L (ref 3.5–5.2)
Sodium: 140 mmol/L (ref 134–144)
Total Protein: 6.9 g/dL (ref 6.0–8.5)
eGFR: 99 mL/min/1.73 (ref >59)

## 2023-12-04 LAB — TSH: TSH: 0.902 u[IU]/mL (ref 0.450–4.500)

## 2023-12-04 LAB — T4, FREE: Free T4: 1.3 ng/dL (ref 0.82–1.77)

## 2023-12-04 LAB — HIV ANTIBODY (ROUTINE TESTING W REFLEX): HIV Screen 4th Generation wRfx: NONREACTIVE

## 2023-12-04 LAB — LACTATE DEHYDROGENASE: LDH: 199 IU/L (ref 121–224)

## 2023-12-05 NOTE — Progress Notes (Signed)
 Patient called and informed of results. No cause for night sweats identified. Patient has also been having chronic diarrhea for months with up to 12 bowel movements per day. He also has associated back pain. The patient would like to follow up after he gets insurance on 9/1. At that time, further workup would include stool studies with fecal calprotectin, ESR, CRP to evaluate for IBD. 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 Consider imaging of the neck with an ultrasound given right-sided fullness. Consider chest imaging for lymph nodes. Patient is very eager to find a cause for his night sweats.

## 2023-12-11 ENCOUNTER — Other Ambulatory Visit: Payer: Self-pay

## 2023-12-11 DIAGNOSIS — J4520 Mild intermittent asthma, uncomplicated: Secondary | ICD-10-CM

## 2023-12-13 ENCOUNTER — Ambulatory Visit (HOSPITAL_COMMUNITY)
Admission: RE | Admit: 2023-12-13 | Discharge: 2023-12-13 | Disposition: A | Discharge status: Home / Self Care | Payer: Self-pay | Source: Ambulatory Visit | Attending: Internal Medicine | Admitting: Internal Medicine

## 2023-12-13 ENCOUNTER — Ambulatory Visit: Payer: Self-pay

## 2023-12-13 ENCOUNTER — Encounter: Payer: Self-pay | Admitting: Internal Medicine

## 2023-12-13 ENCOUNTER — Encounter (HOSPITAL_COMMUNITY): Payer: Self-pay

## 2023-12-13 ENCOUNTER — Other Ambulatory Visit: Payer: Self-pay | Admitting: Internal Medicine

## 2023-12-13 DIAGNOSIS — J4520 Mild intermittent asthma, uncomplicated: Secondary | ICD-10-CM

## 2023-12-13 DIAGNOSIS — J4521 Mild intermittent asthma with (acute) exacerbation: Secondary | ICD-10-CM

## 2023-12-13 MED ORDER — METHACHOLINE 0 MG/ML NEB SOLN
3.0000 mL | Freq: Once | RESPIRATORY_TRACT | Status: DC
  Filled 2023-12-13: qty 3

## 2023-12-13 MED ORDER — PREDNISONE 20 MG PO TABS: 20.0000 mg | ORAL_TABLET | Freq: Every day | ORAL | 0 refills | Status: AC

## 2023-12-13 MED ORDER — METHACHOLINE 4 MG/ML NEB SOLN
3.0000 mL | Freq: Once | RESPIRATORY_TRACT | Status: DC
  Filled 2023-12-13: qty 3

## 2023-12-13 MED ORDER — METHACHOLINE 0.25 MG/ML NEB SOLN
3.0000 mL | Freq: Once | RESPIRATORY_TRACT | Status: DC
  Filled 2023-12-13: qty 3

## 2023-12-13 MED ORDER — METHACHOLINE 0.0625 MG/ML NEB SOLN
3.0000 mL | Freq: Once | RESPIRATORY_TRACT | Status: DC
  Filled 2023-12-13: qty 3

## 2023-12-13 MED ORDER — ALBUTEROL SULFATE (2.5 MG/3ML) 0.083% IN NEBU: 2.5000 mg | INHALATION_SOLUTION | Freq: Once | RESPIRATORY_TRACT | Status: DC

## 2023-12-13 MED ORDER — METHACHOLINE 1 MG/ML NEB SOLN
3.0000 mL | Freq: Once | RESPIRATORY_TRACT | Status: DC
  Filled 2023-12-13: qty 3

## 2023-12-13 MED ORDER — METHACHOLINE 16 MG/ML NEB SOLN
3.0000 mL | Freq: Once | RESPIRATORY_TRACT | Status: DC
  Filled 2023-12-13: qty 3

## 2023-12-13 MED ORDER — AZITHROMYCIN 250 MG PO TABS: ORAL_TABLET | ORAL | 0 refills | Status: AC

## 2023-12-13 NOTE — Telephone Encounter (Signed)
 Spoke with pt via MyChart and he has tested negative for Covid and Dr. Isaiah has sent in z-pak and prednisone . Pt was concerned about the prescription cost for these meds and I advised the pt to contact the pharmacy since we don't have any say on the pricing or pharmacy cards.

## 2023-12-13 NOTE — Progress Notes (Signed)
 Asthma exacerbation Unable to complete MCT Pred and z pak ordered

## 2023-12-13 NOTE — Telephone Encounter (Signed)
 FYI Only or Action Required?: Action required by provider: medication refill request and clinical question for provider.  Patient is followed in Pulmonology for Asthma, last seen on 11/28/2023 by [name], MD.  Called Nurse Triage reporting No chief complaint on file..  Symptoms began several days ago.  Interventions attempted: Nothing.  Symptoms are: gradually worsening.  Triage Disposition: See Physician Within 24 Hours  Patient/caregiver understands and will follow disposition?: Unsure  Copied from CRM #[id number]. Topic: Clinical - Red Word Triage >> Dec 13, 2023  9:46 AM Reginald [name] wrote: Red Word that prompted transfer to Nurse Triage: Patient states he was scheduled for [name] methacholine  challenge, states he was unable to do it because he has an upper respiratory infection, mucus is green. Patient is requesting an antibiotic.   Reason for Disposition  [1] Continuous (nonstop) coughing interferes with work or school AND [2] no improvement using cough treatment per Care Advice  Answer Assessment - Initial Assessment Questions 1. ONSET: When did the cough begin?      Several Days ago  2. SEVERITY: How bad is the cough today?      Coughing Fits  3. SPUTUM: Describe the color of your sputum (e.g., none, dry cough; clear, white, yellow, green)     Green  4. HEMOPTYSIS: Are you coughing up any blood? If Yes, ask: How much? (e.g., flecks, streaks, tablespoons, etc.)     No  5. DIFFICULTY BREATHING: Are you having difficulty breathing? If Yes, ask: How bad is it? (e.g., mild, moderate, severe)      With coughing  6. FEVER: Do you have [name] fever? If Yes, ask: What is your temperature, how was it measured, and when did it start?     Denies fever  7. CARDIAC HISTORY: Do you have any history of heart disease? (e.g., heart attack, congestive heart failure)      Denies  8. LUNG HISTORY: Do you have any history of lung disease?  (e.g., pulmonary embolus,  asthma, emphysema)     Denies  9. PE RISK FACTORS: Do you have [name] history of blood clots? (or: recent major surgery, recent prolonged travel, bedridden)     No  10. OTHER SYMPTOMS: Do you have any other symptoms? (e.g., runny nose, wheezing, chest pain)       Nasal Congestion, Chest Congestion, Nasal Pressure  12. TRAVEL: Have you traveled out of the country in the last month? (e.g., travel history, exposures)       Denies exposure  Patient explained that the methacholine  test was canceled this morning as [name] result of [name] new onset of symptoms and respiratory issues. Called from the hospital where testing is supposed to be done. States he does not have insurance until the first of the next month and he cannot afford [name] visit right now. Despite this states he needs an antibiotic to treat this infection, so he can proceed with the testing. Best callback number is [phone]. States he does have to go back to work, so please leave [name] Engineer, technical sales.  Protocols used: Cough - Acute Productive-[name]-AH

## 2023-12-15 ENCOUNTER — Ambulatory Visit: Payer: Self-pay

## 2023-12-20 ENCOUNTER — Telehealth: Payer: Self-pay | Admitting: *Deleted

## 2023-12-20 NOTE — Telephone Encounter (Signed)
 RTC to patient states has pneumonia.  Was seen in the Clinics and was given Prednisone  and a Z pack.  States  he gave it to his father who has had a Chest Xray that showed Pneumonia.  Patient was informed that he will need to be seen and that an Xray would need to be done. Refused.  Stated unable to afford now.  Insurance will kick, in the first of next month.   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  Mucous is of different colors.  No fever.  Symptoms are getting worse.  Cough is wet and his chest hurts.  Just wants to get something without having to come in for an appointment if possible.

## 2023-12-21 NOTE — Telephone Encounter (Signed)
 RTC to patient.  Message was left on VM that he will need an appointment in the Clinics prior to getting medications.
